# Patient Record
Sex: Male | Born: 1980 | Race: White | Hispanic: No | Marital: Married | Smoking: Current every day smoker
Health system: Southern US, Community
[De-identification: ages and names within clinical notes are randomized; demographics above are authoritative.]

## PROBLEM LIST (undated history)

## (undated) ENCOUNTER — Ambulatory Visit: Admission: EM | Payer: BC Managed Care – PPO

## (undated) DIAGNOSIS — F419 Anxiety disorder, unspecified: Secondary | ICD-10-CM

## (undated) DIAGNOSIS — G5630 Lesion of radial nerve, unspecified upper limb: Secondary | ICD-10-CM

## (undated) HISTORY — DX: Anxiety disorder, unspecified: F41.9

## (undated) HISTORY — PX: THORACOTOMY: SUR1349

---

## 2005-03-04 ENCOUNTER — Emergency Department (HOSPITAL_COMMUNITY): Admission: EM | Admit: 2005-03-04 | Discharge: 2005-03-04 | Payer: Self-pay | Admitting: Family Medicine

## 2005-03-10 ENCOUNTER — Emergency Department (HOSPITAL_COMMUNITY): Admission: EM | Admit: 2005-03-10 | Discharge: 2005-03-10 | Payer: Self-pay | Admitting: Family Medicine

## 2005-04-01 ENCOUNTER — Ambulatory Visit: Payer: Self-pay | Admitting: Internal Medicine

## 2005-04-05 ENCOUNTER — Emergency Department (HOSPITAL_COMMUNITY): Admission: EM | Admit: 2005-04-05 | Discharge: 2005-04-05 | Payer: Self-pay | Admitting: Emergency Medicine

## 2005-04-22 ENCOUNTER — Ambulatory Visit: Payer: Self-pay | Admitting: Internal Medicine

## 2005-09-02 ENCOUNTER — Emergency Department (HOSPITAL_COMMUNITY): Admission: EM | Admit: 2005-09-02 | Discharge: 2005-09-02 | Payer: Self-pay | Admitting: Family Medicine

## 2006-04-29 ENCOUNTER — Ambulatory Visit: Payer: Self-pay | Admitting: Orthopedic Surgery

## 2006-06-17 ENCOUNTER — Ambulatory Visit: Payer: Self-pay | Admitting: Orthopedic Surgery

## 2008-11-25 ENCOUNTER — Ambulatory Visit: Payer: Self-pay | Admitting: Cardiothoracic Surgery

## 2008-11-25 ENCOUNTER — Inpatient Hospital Stay (HOSPITAL_COMMUNITY): Admission: EM | Admit: 2008-11-25 | Discharge: 2008-11-28 | Payer: Self-pay | Admitting: Emergency Medicine

## 2008-12-07 ENCOUNTER — Ambulatory Visit: Payer: Self-pay | Admitting: Cardiothoracic Surgery

## 2008-12-07 ENCOUNTER — Encounter: Admission: RE | Admit: 2008-12-07 | Discharge: 2008-12-07 | Payer: Self-pay | Admitting: Cardiothoracic Surgery

## 2010-01-16 ENCOUNTER — Emergency Department (HOSPITAL_COMMUNITY): Admission: EM | Admit: 2010-01-16 | Discharge: 2010-01-16 | Payer: Self-pay | Admitting: Emergency Medicine

## 2010-03-16 ENCOUNTER — Emergency Department (HOSPITAL_COMMUNITY): Admission: EM | Admit: 2010-03-16 | Discharge: 2010-03-16 | Payer: Self-pay | Admitting: Emergency Medicine

## 2010-12-09 LAB — POCT I-STAT, CHEM 8
Chloride: 100 mEq/L (ref 96–112)
Creatinine, Ser: 0.8 mg/dL (ref 0.4–1.5)
Glucose, Bld: 102 mg/dL — ABNORMAL HIGH (ref 70–99)
HCT: 52 % (ref 39.0–52.0)
Hemoglobin: 17.7 g/dL — ABNORMAL HIGH (ref 13.0–17.0)
Potassium: 4.1 mEq/L (ref 3.5–5.1)
Sodium: 137 mEq/L (ref 135–145)

## 2010-12-09 LAB — URINALYSIS, ROUTINE W REFLEX MICROSCOPIC: Ketones, ur: 40 mg/dL — AB

## 2011-01-01 LAB — POCT I-STAT, CHEM 8
BUN: 8 mg/dL (ref 6–23)
Chloride: 108 mEq/L (ref 96–112)
Creatinine, Ser: 0.8 mg/dL (ref 0.4–1.5)
HCT: 48 % (ref 39.0–52.0)
Hemoglobin: 16.3 g/dL (ref 13.0–17.0)
Potassium: 3.1 mEq/L — ABNORMAL LOW (ref 3.5–5.1)
Sodium: 143 mEq/L (ref 135–145)
TCO2: 25 mmol/L (ref 0–100)

## 2011-02-03 NOTE — Op Note (Signed)
NAME:  [name] Russell, [name] Russell NO.:  [id number]   MEDICAL RECORD NO.:  [id number]          PATIENT TYPE:  INP   LOCATION:  2040                         FACILITY:  [facility]   PHYSICIAN:  Kerin Perna, M.D.  DATE OF BIRTH:  1981/09/05   DATE OF PROCEDURE:  11/25/2008  DATE OF DISCHARGE:                               OPERATIVE REPORT   OPERATION:  Placement of left chest tube for left spontaneous  pneumothorax.   SURGEON:  Kerin Perna, MD   PREOPERATIVE DIAGNOSIS:  25% left pneumothorax, spontaneous.   POSTOPERATIVE DIAGNOSIS:  25% left pneumothorax, spontaneous.   ANESTHESIA:  Local 1% lidocaine.   INDICATIONS FOR PROCEDURE:  The patient is a 30 year old Caucasian  smoker, who presents with his second episode of spontaneous left  pneumothorax.  The first episode was treated conservatively.  At this  time in the emergency department, his pneumothorax is 25%.  He has no  history of preceding trauma or violent coughing.  He has had shortness  of breath and chest pain.  The last time this occurred was 2 years ago  when he was hospitalized at [facility] and treated conservatively  without a chest tube.  He smokes approximately 2 pack of cigarettes a  day and is a Consulting civil engineer at [employer], currently on Spring Break.   PAST MEDICAL HISTORY:  1. No home medications.  2. Two packs per day smoking.  3. Allergic to PENICILLIN with swelling and rash.   FAMILY HISTORY:  Negative for spontaneous pneumothorax.   REVIEW OF SYSTEMS:  No significant prior hospitalizations or surgical  procedures.  No history of significant trauma or injury.  No history of  diabetes, thyroid disease, or vascular disease.  No history of fever,  productive cough, hemoptysis, or weight loss.  No history of peptic  ulcer disease, jaundice, or gallstones.  No cardiac history of  arrhythmia or murmur.   PHYSICAL EXAMINATION ON PRESENTATION:  VITAL SIGNS:  Blood pressure  120/70, pulse 110,  respirations 20, temperature 97.3, and saturation 97%  on room air.  GENERAL:  He is a young, thin white male, anxious, but in no acute  respiratory distress.  HEENT:  Normocephalic.  NECK:  Without crepitus or mass.  LYMPHATICS:  No palpable supraclavicular or cervical adenopathy.  BREASTS:  Sounds are diminished on the left, normal on the right without  wheeze.  CARDIAC:  Regular rhythm without murmur.  ABDOMEN:  Soft and nontender without mass.  EXTREMITIES:  No clubbing or cyanosis or edema.  Peripheral pulses were  intact.  NEUROLOGIC:  Intact.   LABORATORY DATA:  PA and lateral chest x-ray reveals a left 25%  pneumothorax without pleural effusion.   PLAN:  The patient will be admitted after a left chest tube is placed.      Kerin Perna, M.D.  Electronically Signed     PV/MEDQ  D:  11/25/2008  T:  11/26/2008  Job:  [id number]

## 2011-02-03 NOTE — Assessment & Plan Note (Signed)
OFFICE VISIT   [name] Russell, [name] Russell  DOB:  1981/03/30                                        December 07, 2008  CHART #:  [id number]   CURRENT PROBLEMS:  1. Left spontaneous pneumothorax, treated with a chest tube placement      November 25, 2008.  2. History of smoking.   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  After 3 days  in the hospital, the chest tube was removed and he was discharged.  He  continues to do well at home without recurrent chest pain or shortness  of breath.  He has cut his smoking back to 3-4 cigarettes a day.   PHYSICAL EXAMINATION:  VITAL SIGNS:  Blood pressure 120/70, pulse 80,  respirations 18, and saturation 98% on room air.  LUNGS:  Breath sounds are clear and equal.  CHEST:  The chest tube site is healed and sutures are removed.  CARDIAC:  Rhythm is regular and he has no subcutaneous emphysema  palpable with good pulses.   PA and lateral chest x-ray shows no evidence of recurrent or residual  pneumothorax, no pleural effusion.   PLAN:  The patient had a spontaneous pneumothorax related to probable  apical bleb.  I discussed with him the risk of recurrence to be  approximately 15-20%.  Otherwise, I have recommended that he avoid  strenuous activity or lifting more than 20 pounds for the next 2  weeks and to stop smoking.  If any symptoms recur, he will contact our  office or report to the emergency department if after hours.   Kerin Perna, M.D.  Electronically Signed   PV/MEDQ  D:  12/07/2008  T:  12/08/2008  Job:  [id number]

## 2011-02-03 NOTE — Op Note (Signed)
NAME:  CHRISTOPHERJAME, CARNELL NO.:  [id number]   MEDICAL RECORD NO.:  [id number]          PATIENT TYPE:  INP   LOCATION:  2040                         FACILITY:  [facility]   PHYSICIAN:  Kerin Perna, M.D.  DATE OF BIRTH:  1981-04-13   DATE OF PROCEDURE:  DATE OF DISCHARGE:                               OPERATIVE REPORT   OPERATION:  Left chest tube placement for spontaneous pneumothorax.   SURGEON:  Kerin Perna, MD   ANESTHESIA:  Local 1% lidocaine.   PREOPERATIVE DIAGNOSIS:  Spontaneous left 25% pneumothorax.   POSTOPERATIVE DIAGNOSIS:  Spontaneous left 25% pneumothorax.   PROCEDURE:  The patient presents with shortness of breath, chest pain,  and a 25% pneumothorax on chest x-ray which is the second occurrence at  that site.  Indications, benefits, and risks of left chest tube were  discussed and he understands and consents.   PROCEDURE:  After intravenous sedation with morphine and Versed was  administered, the left chest was prepped and draped.  Lidocaine 1% was  infiltrated in the left inframammary crease.  A 1-inch incision was  made.  Further lidocaine was instilled onto the intercostal muscles.  The left pleural space was entered with a hemostat and air was  evacuated.  Through the entry site, a 20-French chest tube was inserted  and directed to the apex and secured to the skin with 2 silk sutures.  The chest tube was connected to underwater steel drainage system, and  the Pleur-Evac was connected to suction, and a chest x-ray is pending.  A sterile dressing was applied.      Kerin Perna, M.D.  Electronically Signed     PV/MEDQ  D:  11/25/2008  T:  11/26/2008  Job:  [id number]   cc:   [facility].

## 2011-02-03 NOTE — Discharge Summary (Signed)
NAME:  Russell Russell NO.:  [id number]   MEDICAL RECORD NO.:  [id number]          PATIENT TYPE:  INP   LOCATION:  2040                         FACILITY:  [facility]   PHYSICIAN:  Kerin Perna, M.D.  DATE OF BIRTH:  April 11, 1981   DATE OF ADMISSION:  11/25/2008  DATE OF DISCHARGE:                               DISCHARGE SUMMARY   FINAL DIAGNOSIS:  Spontaneous left pneumothorax.   SECONDARY DIAGNOSIS:  Tobacco abuse.   IN-HOSPITAL OPERATIONS AND PROCEDURES:  Placement of a 20-French left  chest tube.   HISTORY AND PHYSICAL/HOSPITAL COURSE:  The patient came into the  emergency room with complaints of shortness of breath and chest pain.  He has a history of a left pneumothorax, which was about 2 years ago  where he was hospitalized at [facility] and treated  conservatively without a chest tube.  The patient continues to smoke  about 2 packs of cigarettes a day and is a Consulting civil engineer at [employer].  In the  emergency room, a chest x-ray was done and showed a 25% left recurrent  pneumothorax.  Dr. Donata Clay was consulted.  Dr. Donata Clay saw the  patient in the emergency room where he discussed undergoing placement of  a left chest tube and admission to [facility].  For further  details of the patient's past medical history and physical exam, please  see dictated H and P.   In the emergency room on November 25, 2008, Dr. Donata Clay placed a 20-French  left chest tube.  The patient was then admitted to [facility].  Followup chest x-ray after placement of the chest tube showed no  pneumothorax.  The patient had no air leak.  He was placed to water seal  on November 27, 2008, where repeat chest x-ray remained stable.  On November 28, 2008, a.m. chest x-ray showed no pneumothorax with no air leak.  Chest tube was discontinued.  A followup PA and lateral chest x-ray  remained stable with no pneumothorax.  During this time, the patient's  vital signs were monitored and  remained stable.  He was sating greater  than 90% on room air.  He remained afebrile.  He remained in normal  sinus rhythm.  The patient was out of bed, ambulating well without  difficulty.  He was tolerating diet well.  No nausea or vomiting noted.  Chest tube site is clean, dry, intact, and suture remains in place.   The patient is felt to be stable and ready for discharge to home today  November 28, 2008.   FOLLOWUP APPOINTMENTS:  Followup appointment has been arranged with Dr.  Donata Clay for December 07, 2008, at 2:30 p.m.  The patient will need to  obtain PA and lateral chest x-ray 30 minutes prior to this appointment.   ACTIVITY:  The patient instructed no driving until released to do so, no  lifting over 10 pounds.  He is told to ambulate 3-4 times per day,  progress as tolerated and continue his breathing exercises.   INCISIONAL CARE:  The patient is told he  could shower, washing his  incisions using soap and water.  He is to contact the office if he  develops any drainage or opening from any of his incision sites.   DIET:  The patient is educated on diet to be low fat, low salt.  The  patient is educated to stop smoking.   DISCHARGE MEDICATIONS:  1. Alavert D-12 daily.  2. Nicotine patch 21 mg per 24 hours to change daily transdermally.  3. Percocet 5/325 one to two tablets q.4-6 hours p.r.n. pain.      Theda [place], [place]      Kerin Perna, M.D.  Electronically Signed    KMD/MEDQ  D:  11/28/2008  T:  11/29/2008  Job:  [id number]

## 2011-08-27 ENCOUNTER — Other Ambulatory Visit: Payer: Self-pay

## 2011-08-27 ENCOUNTER — Emergency Department (HOSPITAL_COMMUNITY): Payer: BC Managed Care – PPO

## 2011-08-27 ENCOUNTER — Emergency Department (HOSPITAL_COMMUNITY)
Admission: EM | Admit: 2011-08-27 | Discharge: 2011-08-28 | Disposition: A | Discharge status: Home / Self Care | Payer: BC Managed Care – PPO | Attending: Emergency Medicine | Admitting: Emergency Medicine

## 2011-08-27 ENCOUNTER — Encounter: Payer: Self-pay | Admitting: Emergency Medicine

## 2011-08-27 DIAGNOSIS — R079 Chest pain, unspecified: Secondary | ICD-10-CM | POA: Insufficient documentation

## 2011-08-27 DIAGNOSIS — R0789 Other chest pain: Secondary | ICD-10-CM

## 2011-08-27 DIAGNOSIS — F17200 Nicotine dependence, unspecified, uncomplicated: Secondary | ICD-10-CM | POA: Insufficient documentation

## 2011-08-27 HISTORY — DX: Lesion of radial nerve, unspecified upper limb: G56.30

## 2011-08-27 NOTE — ED Notes (Signed)
PT. REPORTS LEFT ANTERIOR RIBCAGE PAIN ONSET 3 DAYS AGO , WORSE THIS EVENING ,  SOB , DENIES COUGH , NO NAUSEA OR VOMITTING , SLIGHT DIZZINESS.

## 2011-08-28 MED ORDER — HYDROCODONE-ACETAMINOPHEN 5-325 MG PO TABS: 1.0000 | ORAL_TABLET | Freq: Four times a day (QID) | ORAL | Status: AC | PRN

## 2011-08-28 NOTE — ED Provider Notes (Signed)
Medical screening examination/treatment/procedure(s) were performed by non-physician practitioner and as supervising physician I was immediately available for consultation/collaboration.  Kavaughn Faucett M Evyn Putzier, MD 08/28/11 1746 

## 2011-08-28 NOTE — ED Provider Notes (Signed)
History     CSN: [id number] Arrival date & time: 08/27/2011 11:13 PM   First MD Initiated Contact with Patient 08/28/11 0047      Chief Complaint  Patient presents with  . Chest Pain    (Consider location/radiation/quality/duration/timing/severity/associated sxs/prior treatment) HPI Comments: Patient presents to the emergency department with anterior left rib cage pain.  Pain began 3 days ago.  Patient states concerned because he has a history of spontaneous pneumothorax.  He denies fevers, night sweats, chills, cough, hemoptysis, claudication.  Patient denies recent travel, a history of radiation or cancer, and has no history of DVT.  Patient's family history is negative for cardiac problems.  Patient does not have a PCP.  Patient is a 30 y.o. male presenting with chest pain. The history is provided by the patient.  Chest Pain The chest pain began 3 - 5 days ago. Chest pain occurs constantly. The chest pain is unchanged. The quality of the pain is described as aching. The pain does not radiate. Primary symptoms include shortness of breath. Pertinent negatives for primary symptoms include no fever, no fatigue, no syncope, no cough, no wheezing, no palpitations, no abdominal pain, no nausea, no vomiting, no dizziness and no altered mental status.  Pertinent negatives for associated symptoms include no claudication, no diaphoresis, no lower extremity edema, no near-syncope, no numbness, no orthopnea, no paroxysmal nocturnal dyspnea and no weakness. He tried nothing for the symptoms. Risk factors include no known risk factors.  His past medical history is significant for spontaneous pneumothorax.  Pertinent negatives for family medical history include: family history of aortic dissection, no CAD in family, no diabetes in family, no heart disease in family, no early MI in family, no PE in family and no PVD in family.     Past Medical History  Diagnosis Date  . Pneumothorax   . Migraine   .  Radial nerve palsy     Past Surgical History  Procedure Date  . Thoracotomy     No family history on file.  History  Substance Use Topics  . Smoking status: Current Everyday Smoker  . Smokeless tobacco: Not on file  . Alcohol Use: Yes     OCCASIONAL      Review of Systems  Constitutional: Negative for fever, diaphoresis and fatigue.  Respiratory: Positive for shortness of breath. Negative for cough and wheezing.   Cardiovascular: Positive for chest pain. Negative for palpitations, orthopnea, claudication, syncope and near-syncope.  Gastrointestinal: Negative for nausea, vomiting and abdominal pain.  Neurological: Negative for dizziness, weakness and numbness.  Psychiatric/Behavioral: Negative for altered mental status.  All other systems reviewed and are negative.    Allergies  Amoxicillin; Doxycycline; and Penicillins  Home Medications   Current Outpatient Rx  Name Route Sig Dispense Refill  . ACETAMINOPHEN 500 MG PO TABS Oral Take 1,000 mg by mouth every 6 (six) hours as needed. For migraine      . ALPRAZOLAM 0.25 MG PO TABS Oral Take 0.25 mg by mouth daily as needed. For anxiety     . NAPROXEN SODIUM 220 MG PO TABS Oral Take 220 mg by mouth 2 (two) times daily with a meal.        BP 126/83  SpO2 100%  Physical Exam  Nursing note and vitals reviewed. Constitutional: He is oriented to person, place, and time. He appears well-developed and well-nourished. No distress.  HENT:  Head: Normocephalic and atraumatic.  Eyes:       Normal appearance  Neck:  Normal range of motion.  Cardiovascular: Normal rate, regular rhythm and normal heart sounds.   Pulmonary/Chest: Effort normal and breath sounds normal. No respiratory distress. He exhibits tenderness.    Abdominal: Soft. Bowel sounds are normal. He exhibits no distension. There is no tenderness. There is no guarding.  Musculoskeletal: He exhibits no edema and no tenderness.       2+ Dorsalis Pedis pulses    Neurological: He is alert and oriented to person, place, and time. GCS eye subscore is 4. GCS verbal subscore is 5. GCS motor subscore is 6.  Skin: Skin is warm and dry. No rash noted. He is not diaphoretic.  Psychiatric: He has a normal mood and affect. His behavior is normal.    ED Course  Procedures (including critical care time)  Labs Reviewed - No data to display Dg Chest 2 View  08/27/2011  *RADIOLOGY REPORT*  Clinical Data: Chest pain and tightness; shortness of breath. History of spontaneous pneumothorax.  CHEST - 2 VIEW  Comparison: Chest radiograph performed 12/07/2008  Findings: The lungs are well-aerated.  There is no evidence of focal opacification, pleural effusion or pneumothorax.  There is increased biapical scarring, with mildly more prominent associated right apical blebs.  The heart is normal in size; the mediastinal contour is within normal limits.  No acute osseous abnormalities are seen.  Bilateral metallic nipple piercings are noted.  IMPRESSION:  1.  No acute cardiopulmonary process seen. 2.  Increased biapical scarring noted, with mildly more prominent associated right apical blebs.  Original Report Authenticated By: Tonia Ghent, M.D.     No diagnosis found.  Pt discussed w Dr. Fonnie Jarvis who agrees with plan to dc. Pt states he will make a follow up apt next week.   MDM  Chest wall pain         [place], [place] 08/28/11 0104

## 2013-04-03 ENCOUNTER — Other Ambulatory Visit: Payer: Self-pay | Admitting: Nurse Practitioner

## 2013-04-03 ENCOUNTER — Telehealth: Payer: Self-pay | Admitting: Nurse Practitioner

## 2013-04-03 MED ORDER — BUPROPION HCL ER (XL) 150 MG PO TB24: 300.0000 mg | ORAL_TABLET | Freq: Every day | ORAL | Status: DC

## 2013-04-03 NOTE — Telephone Encounter (Signed)
Tried to contact patient to find out about med and dose because not in computer but he did not answer

## 2013-04-04 MED ORDER — BUPROPION HCL ER (SR) 150 MG PO TB12: 150.0000 mg | ORAL_TABLET | Freq: Two times a day (BID) | ORAL | Status: DC

## 2013-04-04 NOTE — Telephone Encounter (Signed)
wellbutrin ° °

## 2013-07-31 ENCOUNTER — Other Ambulatory Visit: Payer: Self-pay | Admitting: Nurse Practitioner

## 2013-08-02 ENCOUNTER — Telehealth: Payer: Self-pay | Admitting: Nurse Practitioner

## 2013-08-02 ENCOUNTER — Encounter: Payer: Self-pay | Admitting: General Practice

## 2013-08-02 ENCOUNTER — Ambulatory Visit (INDEPENDENT_AMBULATORY_CARE_PROVIDER_SITE_OTHER): Payer: BC Managed Care – PPO | Admitting: General Practice

## 2013-08-02 ENCOUNTER — Encounter (INDEPENDENT_AMBULATORY_CARE_PROVIDER_SITE_OTHER): Payer: Self-pay

## 2013-08-02 VITALS — BP 116/81 | HR 82 | Temp 97.2°F | Ht 69.0 in | Wt 172.0 lb

## 2013-08-02 DIAGNOSIS — F411 Generalized anxiety disorder: Secondary | ICD-10-CM

## 2013-08-02 DIAGNOSIS — G43909 Migraine, unspecified, not intractable, without status migrainosus: Secondary | ICD-10-CM

## 2013-08-02 MED ORDER — HYDROXYZINE HCL 10 MG PO TABS: 10.0000 mg | ORAL_TABLET | Freq: Every day | ORAL | Status: DC | PRN

## 2013-08-02 MED ORDER — KETOROLAC TROMETHAMINE 60 MG/2ML IM SOLN
60.0000 mg | Freq: Once | INTRAMUSCULAR | Status: AC
  Administered 2013-08-02: 60 mg via INTRAMUSCULAR

## 2013-08-02 NOTE — Telephone Encounter (Signed)
APPT MADE

## 2013-08-02 NOTE — Patient Instructions (Addendum)
Smoking Cessation Quitting smoking is important to your health and has many advantages. However, it is not always easy to quit since nicotine is a very addictive drug. Often times, people try 3 times or more before being able to quit. This document explains the best ways for you to prepare to quit smoking. Quitting takes hard work and a lot of effort, but you can do it. ADVANTAGES OF QUITTING SMOKING  You will live longer, feel better, and live better.  Your body will feel the impact of quitting smoking almost immediately.  Within 20 minutes, blood pressure decreases. Your pulse returns to its normal level.  After 8 hours, carbon monoxide levels in the blood return to normal. Your oxygen level increases.  After 24 hours, the chance of having a heart attack starts to decrease. Your breath, hair, and body stop smelling like smoke.  After 48 hours, damaged nerve endings begin to recover. Your sense of taste and smell improve.  After 72 hours, the body is virtually free of nicotine. Your bronchial tubes relax and breathing becomes easier.  After 2 to 12 weeks, lungs can hold more air. Exercise becomes easier and circulation improves.  The risk of having a heart attack, stroke, cancer, or lung disease is greatly reduced.  After 1 year, the risk of coronary heart disease is cut in half.  After 5 years, the risk of stroke falls to the same as a nonsmoker.  After 10 years, the risk of lung cancer is cut in half and the risk of other cancers decreases significantly.  After 15 years, the risk of coronary heart disease drops, usually to the level of a nonsmoker.  If you are pregnant, quitting smoking will improve your chances of having a healthy baby.  The people you live with, especially any children, will be healthier.  You will have extra money to spend on things other than cigarettes. QUESTIONS TO THINK ABOUT BEFORE ATTEMPTING TO QUIT You may want to talk about your answers with your  caregiver.  Why do you want to quit?  If you tried to quit in the past, what helped and what did not?  What will be the most difficult situations for you after you quit? How will you plan to handle them?  Who can help you through the tough times? Your family? Friends? A caregiver?  What pleasures do you get from smoking? What ways can you still get pleasure if you quit? Here are some questions to ask your caregiver:  How can you help me to be successful at quitting?  What medicine do you think would be best for me and how should I take it?  What should I do if I need more help?  What is smoking withdrawal like? How can I get information on withdrawal? GET READY  Set a quit date.  Change your environment by getting rid of all cigarettes, ashtrays, matches, and lighters in your home, car, or work. Do not let people smoke in your home.  Review your past attempts to quit. Think about what worked and what did not. GET SUPPORT AND ENCOURAGEMENT You have a better chance of being successful if you have help. You can get support in many ways.  Tell your family, friends, and co-workers that you are going to quit and need their support. Ask them not to smoke around you.  Get individual, group, or telephone counseling and support. Programs are available at local hospitals and health centers. Call your local health department for   information about programs in your area.  Spiritual beliefs and practices may help some smokers quit.  Download a "quit meter" on your computer to keep track of quit statistics, such as how long you have gone without smoking, cigarettes not smoked, and money saved.  Get a self-help book about quitting smoking and staying off of tobacco. LEARN NEW SKILLS AND BEHAVIORS  Distract yourself from urges to smoke. Talk to someone, go for a walk, or occupy your time with a task.  Change your normal routine. Take a different route to work. Drink tea instead of coffee.  Eat breakfast in a different place.  Reduce your stress. Take a hot bath, exercise, or read a book.  Plan something enjoyable to do every day. Reward yourself for not smoking.  Explore interactive web-based programs that specialize in helping you quit. GET MEDICINE AND USE IT CORRECTLY Medicines can help you stop smoking and decrease the urge to smoke. Combining medicine with the above behavioral methods and support can greatly increase your chances of successfully quitting smoking.  Nicotine replacement therapy helps deliver nicotine to your body without the negative effects and risks of smoking. Nicotine replacement therapy includes nicotine gum, lozenges, inhalers, nasal sprays, and skin patches. Some may be available over-the-counter and others require a prescription.  Antidepressant medicine helps people abstain from smoking, but how this works is unknown. This medicine is available by prescription.  Nicotinic receptor partial agonist medicine simulates the effect of nicotine in your brain. This medicine is available by prescription. Ask your caregiver for advice about which medicines to use and how to use them based on your health history. Your caregiver will tell you what side effects to look out for if you choose to be on a medicine or therapy. Carefully read the information on the package. Do not use any other product containing nicotine while using a nicotine replacement product.  RELAPSE OR DIFFICULT SITUATIONS Most relapses occur within the first 3 months after quitting. Do not be discouraged if you start smoking again. Remember, most people try several times before finally quitting. You may have symptoms of withdrawal because your body is used to nicotine. You may crave cigarettes, be irritable, feel very hungry, cough often, get headaches, or have difficulty concentrating. The withdrawal symptoms are only temporary. They are strongest when you first quit, but they will go away within  10 14 days. To reduce the chances of relapse, try to:  Avoid drinking alcohol. Drinking lowers your chances of successfully quitting.  Reduce the amount of caffeine you consume. Once you quit smoking, the amount of caffeine in your body increases and can give you symptoms, such as a rapid heartbeat, sweating, and anxiety.  Avoid smokers because they can make you want to smoke.  Do not let weight gain distract you. Many smokers will gain weight when they quit, usually less than 10 pounds. Eat a healthy diet and stay active. You can always lose the weight gained after you quit.  Find ways to improve your mood other than smoking. FOR MORE INFORMATION  www.smokefree.gov  Document Released: 09/01/2001 Document Revised: 03/08/2012 Document Reviewed: 12/17/2011 [facility] Patient Information 2014 [place], [place].  Migraine Headache A migraine headache is an intense, throbbing pain on one or both sides of your head. A migraine can last for 30 minutes to several hours. CAUSES  The exact cause of a migraine headache is not always known. However, a migraine may be caused when nerves in the brain become irritated and release chemicals  that cause inflammation. This causes pain. SYMPTOMS  Pain on one or both sides of your head.  Pulsating or throbbing pain.  Severe pain that prevents daily activities.  Pain that is aggravated by any physical activity.  Nausea, vomiting, or both.  Dizziness.  Pain with exposure to bright lights, loud noises, or activity.  General sensitivity to bright lights, loud noises, or smells. Before you get a migraine, you may get warning signs that a migraine is coming (aura). An aura may include:  Seeing flashing lights.  Seeing bright spots, halos, or zig-zag lines.  Having tunnel vision or blurred vision.  Having feelings of numbness or tingling.  Having trouble talking.  Having muscle weakness. MIGRAINE  TRIGGERS  Alcohol.  Smoking.  Stress.  Menstruation.  Aged cheeses.  Foods or drinks that contain nitrates, glutamate, aspartame, or tyramine.  Lack of sleep.  Chocolate.  Caffeine.  Hunger.  Physical exertion.  Fatigue.  Medicines used to treat chest pain (nitroglycerine), birth control pills, estrogen, and some blood pressure medicines. DIAGNOSIS  A migraine headache is often diagnosed based on:  Symptoms.  Physical examination.  A CT scan or MRI of your head. TREATMENT Medicines may be given for pain and nausea. Medicines can also be given to help prevent recurrent migraines.  HOME CARE INSTRUCTIONS  Only take over-the-counter or prescription medicines for pain or discomfort as directed by your caregiver. The use of long-term narcotics is not recommended.  Lie down in a dark, quiet room when you have a migraine.  Keep a journal to find out what may trigger your migraine headaches. For example, write down:  What you eat and drink.  How much sleep you get.  Any change to your diet or medicines.  Limit alcohol consumption.  Quit smoking if you smoke.  Get 7 to 9 hours of sleep, or as recommended by your caregiver.  Limit stress.  Keep lights dim if bright lights bother you and make your migraines worse. SEEK IMMEDIATE MEDICAL CARE IF:   Your migraine becomes severe.  You have a fever.  You have a stiff neck.  You have vision loss.  You have muscular weakness or loss of muscle control.  You start losing your balance or have trouble walking.  You feel faint or pass out.  You have severe symptoms that are different from your first symptoms. MAKE SURE YOU:   Understand these instructions.  Will watch your condition.  Will get help right away if you are not doing well or get worse. Document Released: 09/07/2005 Document Revised: 11/30/2011 Document Reviewed: 08/28/2011 [facility] Patient Information 2014 [place], [place].

## 2013-08-02 NOTE — Progress Notes (Signed)
  Subjective:    Patient ID: [name] Russell, male    DOB: 04/22/1981, 32 y.o.   MRN: [id number]  HPI Patient presents today for medication refill. He reports being on wellbutrin sr, that he began for anxiety and to stop smoking. Reports not taking the medication (wellbutrin) for 3 days and can't tell a difference. He would like to try a different medication for anxiety and verbalizes that he isn't mentally ready to stop smoking. He reports a history of migraines that began at age 44. He reports being seen by several providers, but denies a headache clinic. Reports having a headache today that is similar to migraine (sensitive to light and sound). Yeiren reports having a stressful job and working 60 hours a week at times. He reports liking his job. Denies thoughts of harming self or others. Reports taking xanax in the past and is inquiring about restarting this medication.     Review of Systems  Constitutional: Negative for fever and chills.  Respiratory: Negative for chest tightness and shortness of breath.   Cardiovascular: Negative for chest pain and palpitations.  Neurological: Positive for headaches. Negative for dizziness, speech difficulty and weakness.  Psychiatric/Behavioral: Negative for suicidal ideas and self-injury.       Objective:   Physical Exam  Constitutional: He is oriented to person, place, and time. He appears well-developed and well-nourished.  HENT:  Head: Normocephalic and atraumatic.  Right Ear: External ear normal.  Left Ear: External ear normal.  Eyes: EOM are normal. Pupils are equal, round, and reactive to light.  Neck: Normal range of motion. Neck supple. No thyromegaly present.  Cardiovascular: Normal rate, regular rhythm and normal heart sounds.   Pulmonary/Chest: Effort normal and breath sounds normal.  Lymphadenopathy:    He has no cervical adenopathy.  Neurological: He is alert and oriented to person, place, and time.  Skin: Skin is warm and dry.   Psychiatric: He has a normal mood and affect.          Assessment & Plan:  1. Migraine  - AMB referral to headache clinic - ketorolac (TORADOL) injection 60 mg; Inject 2 mLs (60 mg total) into the muscle once. -discussed keeping a diary of when headaches occur and activities surrounding -RTO is symptoms worsen or seek emergency medical treatment  2. Generalized anxiety disorder -discussed lexapro, but patient decline, he inquired about xanax or ativan -discussed reason that xanax or ativan wasn't being prescribed -discussed stress reduction and relaxation techniques - hydrOXYzine (ATARAX/VISTARIL) 10 MG tablet; Take 1 tablet (10 mg total) by mouth daily as needed.  Dispense: 30 tablet; Refill: 0 -follow up in one week for reevaluation -Patient verbalized understanding Coralie Keens, FNP-C

## 2013-09-18 ENCOUNTER — Other Ambulatory Visit: Payer: Self-pay

## 2013-09-18 DIAGNOSIS — F411 Generalized anxiety disorder: Secondary | ICD-10-CM

## 2013-09-18 MED ORDER — HYDROXYZINE HCL 10 MG PO TABS: 10.0000 mg | ORAL_TABLET | Freq: Every day | ORAL | Status: DC | PRN

## 2014-02-14 ENCOUNTER — Telehealth: Payer: Self-pay | Admitting: General Practice

## 2014-02-14 NOTE — Telephone Encounter (Signed)
appt given for 6/8 with christy hawks

## 2014-02-26 ENCOUNTER — Ambulatory Visit: Payer: BC Managed Care – PPO | Admitting: Family

## 2014-03-14 ENCOUNTER — Ambulatory Visit (INDEPENDENT_AMBULATORY_CARE_PROVIDER_SITE_OTHER): Payer: BC Managed Care – PPO | Admitting: Physician Assistant

## 2014-03-14 ENCOUNTER — Encounter: Payer: Self-pay | Admitting: Physician Assistant

## 2014-03-14 VITALS — BP 110/76 | Temp 98.8°F | Ht 69.0 in | Wt 179.2 lb

## 2014-03-14 DIAGNOSIS — R5383 Other fatigue: Principal | ICD-10-CM

## 2014-03-14 DIAGNOSIS — R5381 Other malaise: Secondary | ICD-10-CM

## 2014-03-14 DIAGNOSIS — F411 Generalized anxiety disorder: Secondary | ICD-10-CM

## 2014-03-14 MED ORDER — BUPROPION HCL ER (SR) 150 MG PO TB12: 150.0000 mg | ORAL_TABLET | Freq: Two times a day (BID) | ORAL | Status: DC

## 2014-03-14 NOTE — Progress Notes (Signed)
Subjective:     Patient ID: [name] Russell, male   DOB: March 13, 1981, 33 y.o.   MRN: [id number]  HPI Pt here for recheck of his chronic anxiety He has seen multiple providers for same Prev on Wellbutrin which worked for a while then stopped He is also wanting full labs due to fatigue/wgt gain  Review of Systems  Constitutional: Positive for activity change, fatigue and unexpected weight change. Negative for appetite change.  HENT: Negative.   Respiratory: Negative.   Cardiovascular: Negative.   Gastrointestinal: Negative.   Psychiatric/Behavioral: Positive for sleep disturbance and decreased concentration. Negative for suicidal ideas, self-injury and agitation. The patient is nervous/anxious. The patient is not hyperactive.        Objective:   Physical Exam  Nursing note and vitals reviewed. Constitutional: He appears well-developed and well-nourished.  Neck: Neck supple. No thyromegaly present.  Cardiovascular: Normal rate, regular rhythm, normal heart sounds and intact distal pulses.   Pulmonary/Chest: Effort normal and breath sounds normal.  Lymphadenopathy:    He has no cervical adenopathy.  Psychiatric: He has a normal mood and affect. His behavior is normal. Judgment and thought content normal.       Assessment:     Anxiety Fatigue    Plan:     Discussed with pt I was not going to prescribe Xanax/Ativan Full labs done today Discussed some of his sx may be related to depression Also discussed given his long term issues feel his should follow up with Psych Wellbutrin rx today F/U in 3 days

## 2014-03-14 NOTE — Patient Instructions (Signed)
Generalized Anxiety Disorder  Generalized anxiety disorder (GAD) is a mental disorder. It interferes with life functions, including relationships, work, and school.  GAD is different from normal anxiety, which everyone experiences at some point in their lives in response to specific life events and activities. Normal anxiety actually helps us prepare for and get through these life events and activities. Normal anxiety goes away after the event or activity is over.   GAD causes anxiety that is not necessarily related to specific events or activities. It also causes excess anxiety in proportion to specific events or activities. The anxiety associated with GAD is also difficult to control. GAD can vary from mild to severe. People with severe GAD can have intense waves of anxiety with physical symptoms (panic attacks).   SYMPTOMS  The anxiety and worry associated with GAD are difficult to control. This anxiety and worry are related to many life events and activities and also occur more days than not for 6 months or longer. People with GAD also have three or more of the following symptoms (one or more in children):  · Restlessness.    · Fatigue.  · Difficulty concentrating.    · Irritability.  · Muscle tension.  · Difficulty sleeping or unsatisfying sleep.  DIAGNOSIS  GAD is diagnosed through an assessment by your caregiver. Your caregiver will ask you questions about your mood, physical symptoms, and events in your life. Your caregiver may ask you about your medical history and use of alcohol or drugs, including prescription medications. Your caregiver may also do a physical exam and blood tests. Certain medical conditions and the use of certain substances can cause symptoms similar to those associated with GAD. Your caregiver may refer you to a mental health specialist for further evaluation.  TREATMENT  The following therapies are usually used to treat GAD:   · Medication--Antidepressant medication usually is  prescribed for long-term daily control. Antianxiety medications may be added in severe cases, especially when panic attacks occur.    · Talk therapy (psychotherapy)--Certain types of talk therapy can be helpful in treating GAD by providing support, education, and guidance. A form of talk therapy called cognitive behavioral therapy can teach you healthy ways to think about and react to daily life events and activities.  · Stress management techniques--These include yoga, meditation, and exercise and can be very helpful when they are practiced regularly.  A mental health specialist can help determine which treatment is best for you. Some people see improvement with one therapy. However, other people require a combination of therapies.  Document Released: 01/02/2013 Document Reviewed: 01/02/2013  [employer]® Patient Information ©2015 [employer]. This information is not intended to replace advice given to you by your health care Dorraine Ellender. Make sure you discuss any questions you have with your health care Dejuan Elman.

## 2014-03-15 ENCOUNTER — Telehealth: Payer: Self-pay | Admitting: Physician Assistant

## 2014-03-15 LAB — CMP14+EGFR
ALBUMIN: 4.9 g/dL (ref 3.5–5.5)
ALT: 29 IU/L (ref 0–44)
AST: 20 IU/L (ref 0–40)
Albumin/Globulin Ratio: 1.9 (ref 1.1–2.5)
Alkaline Phosphatase: 73 IU/L (ref 39–117)
BUN/Creatinine Ratio: 15 (ref 8–19)
BUN: 11 mg/dL (ref 6–20)
CALCIUM: 10 mg/dL (ref 8.7–10.2)
CHLORIDE: 100 mmol/L (ref 97–108)
CO2: 23 mmol/L (ref 18–29)
CREATININE: 0.72 mg/dL — AB (ref 0.76–1.27)
GFR calc Af Amer: 144 mL/min/{1.73_m2} (ref >59)
GFR, EST NON AFRICAN AMERICAN: 124 mL/min/{1.73_m2} (ref >59)
GLOBULIN, TOTAL: 2.6 g/dL (ref 1.5–4.5)
GLUCOSE: 83 mg/dL (ref 65–99)
Potassium: 4.5 mmol/L (ref 3.5–5.2)
SODIUM: 142 mmol/L (ref 134–144)
TOTAL PROTEIN: 7.5 g/dL (ref 6.0–8.5)
Total Bilirubin: 0.6 mg/dL (ref 0.0–1.2)

## 2014-03-15 LAB — THYROID PANEL WITH TSH
Free Thyroxine Index: 2.8 (ref 1.2–4.9)
T3 UPTAKE RATIO: 26 % (ref 24–39)
T4, Total: 10.6 ug/dL (ref 4.5–12.0)
TSH: 2.56 u[IU]/mL (ref 0.450–4.500)

## 2014-03-15 NOTE — Telephone Encounter (Signed)
Patient aware of results.

## 2014-03-30 ENCOUNTER — Emergency Department (HOSPITAL_COMMUNITY)
Admission: EM | Admit: 2014-03-30 | Discharge: 2014-03-30 | Disposition: A | Discharge status: Home / Self Care | Payer: BC Managed Care – PPO | Attending: Emergency Medicine | Admitting: Emergency Medicine

## 2014-03-30 ENCOUNTER — Encounter (HOSPITAL_COMMUNITY): Payer: Self-pay | Admitting: Emergency Medicine

## 2014-03-30 DIAGNOSIS — F411 Generalized anxiety disorder: Secondary | ICD-10-CM | POA: Insufficient documentation

## 2014-03-30 DIAGNOSIS — R51 Headache: Secondary | ICD-10-CM | POA: Insufficient documentation

## 2014-03-30 DIAGNOSIS — Y9289 Other specified places as the place of occurrence of the external cause: Secondary | ICD-10-CM | POA: Insufficient documentation

## 2014-03-30 DIAGNOSIS — Z8709 Personal history of other diseases of the respiratory system: Secondary | ICD-10-CM | POA: Insufficient documentation

## 2014-03-30 DIAGNOSIS — Z8679 Personal history of other diseases of the circulatory system: Secondary | ICD-10-CM | POA: Insufficient documentation

## 2014-03-30 DIAGNOSIS — W57XXXA Bitten or stung by nonvenomous insect and other nonvenomous arthropods, initial encounter: Secondary | ICD-10-CM | POA: Insufficient documentation

## 2014-03-30 DIAGNOSIS — H65192 Other acute nonsuppurative otitis media, left ear: Secondary | ICD-10-CM

## 2014-03-30 DIAGNOSIS — T148 Other injury of unspecified body region: Secondary | ICD-10-CM | POA: Insufficient documentation

## 2014-03-30 DIAGNOSIS — Z79899 Other long term (current) drug therapy: Secondary | ICD-10-CM | POA: Insufficient documentation

## 2014-03-30 DIAGNOSIS — J029 Acute pharyngitis, unspecified: Secondary | ICD-10-CM | POA: Insufficient documentation

## 2014-03-30 DIAGNOSIS — Z880 Allergy status to penicillin: Secondary | ICD-10-CM | POA: Insufficient documentation

## 2014-03-30 DIAGNOSIS — Y9389 Activity, other specified: Secondary | ICD-10-CM | POA: Insufficient documentation

## 2014-03-30 DIAGNOSIS — H65199 Other acute nonsuppurative otitis media, unspecified ear: Secondary | ICD-10-CM | POA: Insufficient documentation

## 2014-03-30 DIAGNOSIS — R52 Pain, unspecified: Secondary | ICD-10-CM | POA: Insufficient documentation

## 2014-03-30 DIAGNOSIS — Z791 Long term (current) use of non-steroidal anti-inflammatories (NSAID): Secondary | ICD-10-CM | POA: Insufficient documentation

## 2014-03-30 MED ORDER — HYDROXYZINE HCL 25 MG PO TABS
25.0000 mg | ORAL_TABLET | Freq: Once | ORAL | Status: AC
  Administered 2014-03-30: 25 mg via ORAL
  Filled 2014-03-30: qty 1

## 2014-03-30 MED ORDER — DEXAMETHASONE SODIUM PHOSPHATE 4 MG/ML IJ SOLN: 10.0000 mg | Freq: Once | INTRAMUSCULAR | Status: DC

## 2014-03-30 MED ORDER — AZITHROMYCIN 250 MG PO TABS: 250.0000 mg | ORAL_TABLET | Freq: Every day | ORAL | Status: DC

## 2014-03-30 MED ORDER — DEXAMETHASONE SODIUM PHOSPHATE 4 MG/ML IJ SOLN
10.0000 mg | Freq: Once | INTRAMUSCULAR | Status: AC
  Administered 2014-03-30: 10 mg via INTRAMUSCULAR
  Filled 2014-03-30: qty 3

## 2014-03-30 MED ORDER — TRIAMCINOLONE ACETONIDE 0.1 % EX CREA: 1.0000 "application " | TOPICAL_CREAM | Freq: Three times a day (TID) | CUTANEOUS | Status: DC

## 2014-03-30 MED ORDER — HYDROXYZINE HCL 25 MG PO TABS: 25.0000 mg | ORAL_TABLET | Freq: Four times a day (QID) | ORAL | Status: DC

## 2014-03-30 NOTE — Discharge Instructions (Signed)
Insect Bite Mosquitoes, flies, fleas, bedbugs, and other insects can bite. Insect bites are different from insect stings. The bite may be red, puffy (swollen), and itchy for 2 to 4 days. Most bites get better on their own. HOME CARE   Do not scratch the bite.  Keep the bite clean and dry. Wash the bite with soap and water.  Put ice on the bite.  Put ice in a plastic bag.  Place a towel between your skin and the bag.  Leave the ice on for 20 minutes, 4 times a day. Do this for the first 2 to 3 days, or as told by your doctor.  You may use medicated lotions or creams to lessen itching as told by your doctor.  Only take medicines as told by your doctor.  If you are given medicines (antibiotics), take them as told. Finish them even if you start to feel better. You may need a tetanus shot if:  You cannot remember when you had your last tetanus shot.  You have never had a tetanus shot.  The injury broke your skin. If you need a tetanus shot and you choose not to have one, you may get tetanus. Sickness from tetanus can be serious. GET HELP RIGHT AWAY IF:   You have more pain, redness, or puffiness.  You see a red line on the skin coming from the bite.  You have a fever.  You have joint pain.  You have a headache or neck pain.  You feel weak.  You have a rash.  You have chest pain, or you are short of breath.  You have belly (abdominal) pain.  You feel sick to your stomach (nauseous) or throw up (vomit).  You feel very tired or sleepy. MAKE SURE YOU:   Understand these instructions.  Will watch your condition.  Will get help right away if you are not doing well or get worse. Document Released: 09/04/2000 Document Revised: 11/30/2011 Document Reviewed: 04/08/2011 [facility] Patient Information 2015 [place], [place]. This information is not intended to replace advice given to you by your health care provider. Make sure you discuss any questions you have with your health  care provider.  Otitis Media Otitis media is redness, soreness, and swelling (inflammation) of the middle ear. Otitis media may be caused by allergies or, most commonly, by infection. Often it occurs as a complication of the common cold. SIGNS AND SYMPTOMS Symptoms of otitis media may include:  Earache.  Fever.  Ringing in your ear.  Headache.  Leakage of fluid from the ear. DIAGNOSIS To diagnose otitis media, your health care provider will examine your ear with an otoscope. This is an instrument that allows your health care provider to see into your ear in order to examine your eardrum. Your health care provider also will ask you questions about your symptoms. TREATMENT  Typically, otitis media resolves on its own within 3-5 days. Your health care provider may prescribe medicine to ease your symptoms of pain. If otitis media does not resolve within 5 days or is recurrent, your health care provider may prescribe antibiotic medicines if he or she suspects that a bacterial infection is the cause. HOME CARE INSTRUCTIONS   Take your medicine as directed until it is gone, even if you feel better after the first few days.  Only take over-the-counter or prescription medicines for pain, discomfort, or fever as directed by your health care provider.  Follow up with your health care provider as directed. SEEK MEDICAL  CARE IF:  You have otitis media only in one ear, or bleeding from your nose, or both.  You notice a lump on your neck.  You are not getting better in 3-5 days.  You feel worse instead of better. SEEK IMMEDIATE MEDICAL CARE IF:   You have pain that is not controlled with medicine.  You have swelling, redness, or pain around your ear or stiffness in your neck.  You notice that part of your face is paralyzed.  You notice that the bone behind your ear (mastoid) is tender when you touch it. MAKE SURE YOU:   Understand these instructions.  Will watch your  condition.  Will get help right away if you are not doing well or get worse. Document Released: 06/12/2004 Document Revised: 09/12/2013 Document Reviewed: 04/04/2013 [facility] Patient Information 2015 [place]. This information is not intended to replace advice given to you by your health care provider. Make sure you discuss any questions you have with your health care provider.

## 2014-03-30 NOTE — ED Notes (Signed)
Ticks found on me yesterday, breaking out in a rash today and had a headache per pt.

## 2014-03-31 NOTE — ED Provider Notes (Signed)
Medical screening examination/treatment/procedure(s) were performed by non-physician practitioner and as supervising physician I was immediately available for consultation/collaboration.   EKG Interpretation None        Joya Gaskinsonald W Welcome Fults, MD 03/31/14 [phone]

## 2014-03-31 NOTE — ED Provider Notes (Signed)
CSN: [id number]     Arrival date & time 03/30/14  2233 History   First MD Initiated Contact with Patient 03/30/14 2303     Chief Complaint  Patient presents with  . Rash     (Consider location/radiation/quality/duration/timing/severity/associated sxs/prior Treatment) Patient is a 33 y.o. male presenting with rash. The history is provided by the patient.  Rash Location:  Torso and leg Torso rash location:  Lower back, abd LLQ and abd RLQ Leg rash location:  L upper leg, R upper leg, L lower leg and R lower leg Quality: blistering, burning, itchiness and redness   Quality: not bruising, not dry, not painful, not peeling, not scaling, not swelling and not weeping   Severity:  Mild Onset quality:  Sudden Duration:  10 hours Timing:  Constant Progression:  Spreading Chronicity:  New Context: insect bite/sting   Context comment:  Patient work s outside. Relieved by:  Nothing Worsened by:  Nothing tried Ineffective treatments:  None tried Associated symptoms: headaches, myalgias and sore throat   Associated symptoms: no abdominal pain, no fever, no joint pain, no nausea, no periorbital edema, no shortness of breath, no throat swelling, no tongue swelling, not vomiting and not wheezing    Patient c/o left ear pain, intermittent headaches, generalized body aches and sore throat for several days.  He also reports finding several ticks on him yesterday, but states the ticks were not attached.  Today, he states that he noticed multiple  Small red bumps to his abdomen, back and both legs.  Reports extreme itching to his legs.  He denies fever, vomiting, joint pains or neck stiffness.  He has not tried any medications or therpies at home prior to ED arrival.     Past Medical History  Diagnosis Date  . Pneumothorax   . Migraine   . Radial nerve palsy   . Anxiety    Past Surgical History  Procedure Laterality Date  . Thoracotomy     History reviewed. No pertinent family history. History   Substance Use Topics  . Smoking status: Current Every Day Smoker -- 1.00 packs/day  . Smokeless tobacco: Not on file  . Alcohol Use: Yes     Comment: OCCASIONAL    Review of Systems  Constitutional: Negative for fever.  HENT: Positive for sore throat.   Respiratory: Negative for shortness of breath and wheezing.   Cardiovascular: Negative for chest pain.  Gastrointestinal: Negative for nausea, vomiting and abdominal pain.  Musculoskeletal: Positive for myalgias. Negative for arthralgias, back pain, joint swelling, neck pain and neck stiffness.  Skin: Positive for rash.  Neurological: Positive for headaches. Negative for dizziness, syncope, weakness and numbness.      Allergies  Amoxicillin; Doxycycline; and Penicillins  Home Medications   Prior to Admission medications   Medication Sig Start Date End Date Taking? Authorizing Provider  acetaminophen (TYLENOL) 500 MG tablet Take 1,000 mg by mouth every 6 (six) hours as needed. For migraine      Historical Provider, MD  ALPRAZolam (XANAX) 0.25 MG tablet Take 0.25 mg by mouth daily as needed. For anxiety     Historical Provider, MD  azithromycin (ZITHROMAX) 250 MG tablet Take 1 tablet (250 mg total) by mouth daily. Take first 2 tablets together, then 1 every day until finished. 03/30/14   Breiona Couvillon L. Morio Widen, PA-C  buPROPion (WELLBUTRIN SR) 150 MG 12 hr tablet Take 1 tablet (150 mg total) by mouth 2 (two) times daily. 03/14/14   Inis Sizer, PA-C  hydrOXYzine (  ATARAX/VISTARIL) 10 MG tablet Take 1 tablet (10 mg total) by mouth daily as needed. 09/18/13   Coralie KeensMae E Haliburton, FNP  hydrOXYzine (ATARAX/VISTARIL) 25 MG tablet Take 1 tablet (25 mg total) by mouth every 6 (six) hours. As needed for itching 03/30/14   Roshunda Keir L. Ludie Pavlik, PA-C  naproxen sodium (ANAPROX) 220 MG tablet Take 220 mg by mouth 2 (two) times daily with a meal.      Historical Provider, MD  triamcinolone cream (KENALOG) 0.1 % Apply 1 application topically 3 (three)  times daily. 03/30/14   Lynetta Tomczak L. Brinsley Wence, PA-C   BP 123/83  Pulse 90  Temp(Src) 98 F (36.7 C) (Oral)  Resp 20  Ht 5\' 9"  (1.753 m)  Wt 170 lb (77.111 kg)  BMI 25.09 kg/m2  SpO2 98% Physical Exam  Nursing note and vitals reviewed. Constitutional: He is oriented to person, place, and time. He appears well-developed and well-nourished. No distress.  HENT:  Head: Normocephalic and atraumatic.  Right Ear: Tympanic membrane and ear canal normal.  Left Ear: Ear canal normal. No mastoid tenderness. No hemotympanum.  Mouth/Throat: Uvula is midline, oropharynx is clear and moist and mucous membranes are normal.  Erythema of the left TM with middle ear effusion present.  No bulging.    Neck: Normal range of motion. Neck supple.  Cardiovascular: Normal rate, regular rhythm, normal heart sounds and intact distal pulses.   No murmur heard. Pulmonary/Chest: Effort normal and breath sounds normal. No respiratory distress.  Musculoskeletal: Normal range of motion. He exhibits no edema and no tenderness.  Lymphadenopathy:    He has no cervical adenopathy.  Neurological: He is alert and oriented to person, place, and time. He exhibits normal muscle tone. Coordination normal.  Skin: Skin is warm. Rash noted. There is erythema.  Multiple , scattered erythematous papules to the bilateral LE's.  No pustules, target lesions, or petechia    ED Course  Procedures (including critical care time) Labs Review Labs Reviewed - No data to display  Imaging Review No results found.   EKG Interpretation None      MDM   Final diagnoses:  Insect bites  Other acute nonsuppurative otitis media of left ear    Pt is well appearing, non-toxic.  Rash appears c/w insect bites.  Ticks were not attached.  Clinical suspicion for tick related illness is low.  Patient is requesting testing for tick illness, I have advised pt to follow-up with PMD for this.  He agrees to treatment with zithromax for the OM and  steroid cream and atarax for rash symptoms.  Pt ambulates well and appears stable for d/c.      Delanee Xin L. Trisha Mangleriplett, PA-C 03/31/14 [id number]

## 2014-04-16 ENCOUNTER — Other Ambulatory Visit: Payer: Self-pay | Admitting: Family Medicine

## 2014-04-16 ENCOUNTER — Other Ambulatory Visit: Payer: Self-pay | Admitting: *Deleted

## 2014-04-16 MED ORDER — HYDROXYZINE HCL 25 MG PO TABS: 25.0000 mg | ORAL_TABLET | Freq: Four times a day (QID) | ORAL | Status: DC

## 2014-04-16 NOTE — Telephone Encounter (Signed)
Med was called in

## 2014-04-16 NOTE — Telephone Encounter (Signed)
Given in hospital 03/30/14, needs refill only got #12, [facility]

## 2014-05-16 ENCOUNTER — Telehealth: Payer: Self-pay | Admitting: Family Medicine

## 2014-05-16 ENCOUNTER — Other Ambulatory Visit: Payer: Self-pay | Admitting: Physician Assistant

## 2014-05-16 MED ORDER — BUPROPION HCL ER (SR) 150 MG PO TB12: 150.0000 mg | ORAL_TABLET | Freq: Two times a day (BID) | ORAL | Status: DC

## 2014-05-16 NOTE — Telephone Encounter (Signed)
done

## 2014-06-19 ENCOUNTER — Other Ambulatory Visit: Payer: Self-pay | Admitting: Family Medicine

## 2014-07-27 ENCOUNTER — Other Ambulatory Visit: Payer: Self-pay | Admitting: Family Medicine

## 2014-09-22 ENCOUNTER — Other Ambulatory Visit: Payer: Self-pay | Admitting: Family Medicine

## 2014-10-15 ENCOUNTER — Telehealth: Payer: Self-pay | Admitting: Family Medicine

## 2014-10-15 MED ORDER — BUPROPION HCL ER (SR) 150 MG PO TB12: 150.0000 mg | ORAL_TABLET | Freq: Two times a day (BID) | ORAL | Status: DC

## 2014-10-15 NOTE — Telephone Encounter (Signed)
Patient aware rx sent to pharmacy.  

## 2014-10-15 NOTE — Telephone Encounter (Signed)
rx sent to pharmacy

## 2014-10-24 ENCOUNTER — Ambulatory Visit: Payer: Self-pay | Admitting: Family Medicine

## 2015-09-02 ENCOUNTER — Other Ambulatory Visit (HOSPITAL_COMMUNITY): Payer: Self-pay | Admitting: Internal Medicine

## 2015-09-02 DIAGNOSIS — J9383 Other pneumothorax: Secondary | ICD-10-CM

## 2015-09-05 ENCOUNTER — Ambulatory Visit (HOSPITAL_COMMUNITY)
Admission: RE | Admit: 2015-09-05 | Discharge: 2015-09-05 | Disposition: A | Discharge status: Home / Self Care | Payer: BLUE CROSS/BLUE SHIELD | Source: Ambulatory Visit | Attending: Internal Medicine | Admitting: Internal Medicine

## 2015-09-05 ENCOUNTER — Encounter (HOSPITAL_COMMUNITY): Payer: Self-pay

## 2015-09-05 DIAGNOSIS — N62 Hypertrophy of breast: Secondary | ICD-10-CM | POA: Insufficient documentation

## 2015-09-05 DIAGNOSIS — Z8709 Personal history of other diseases of the respiratory system: Secondary | ICD-10-CM | POA: Diagnosis present

## 2015-09-05 DIAGNOSIS — J439 Emphysema, unspecified: Secondary | ICD-10-CM | POA: Insufficient documentation

## 2015-09-05 DIAGNOSIS — R079 Chest pain, unspecified: Secondary | ICD-10-CM | POA: Insufficient documentation

## 2015-09-05 DIAGNOSIS — J9383 Other pneumothorax: Secondary | ICD-10-CM

## 2016-03-05 DIAGNOSIS — H04123 Dry eye syndrome of bilateral lacrimal glands: Secondary | ICD-10-CM | POA: Diagnosis not present

## 2016-07-08 DIAGNOSIS — R5383 Other fatigue: Secondary | ICD-10-CM | POA: Diagnosis not present

## 2016-07-08 DIAGNOSIS — Z6828 Body mass index (BMI) 28.0-28.9, adult: Secondary | ICD-10-CM | POA: Diagnosis not present

## 2016-07-08 DIAGNOSIS — J40 Bronchitis, not specified as acute or chronic: Secondary | ICD-10-CM | POA: Diagnosis not present

## 2016-10-20 ENCOUNTER — Encounter (HOSPITAL_COMMUNITY): Payer: Self-pay | Admitting: Emergency Medicine

## 2016-10-20 ENCOUNTER — Emergency Department (HOSPITAL_COMMUNITY)
Admission: EM | Admit: 2016-10-20 | Discharge: 2016-10-20 | Disposition: A | Discharge status: Home / Self Care | Payer: BLUE CROSS/BLUE SHIELD | Attending: Emergency Medicine | Admitting: Emergency Medicine

## 2016-10-20 ENCOUNTER — Emergency Department (HOSPITAL_COMMUNITY): Payer: BLUE CROSS/BLUE SHIELD

## 2016-10-20 DIAGNOSIS — M546 Pain in thoracic spine: Secondary | ICD-10-CM | POA: Diagnosis not present

## 2016-10-20 DIAGNOSIS — Y999 Unspecified external cause status: Secondary | ICD-10-CM | POA: Insufficient documentation

## 2016-10-20 DIAGNOSIS — T148XXA Other injury of unspecified body region, initial encounter: Secondary | ICD-10-CM

## 2016-10-20 DIAGNOSIS — S39012A Strain of muscle, fascia and tendon of lower back, initial encounter: Secondary | ICD-10-CM | POA: Insufficient documentation

## 2016-10-20 DIAGNOSIS — Y939 Activity, unspecified: Secondary | ICD-10-CM | POA: Diagnosis not present

## 2016-10-20 DIAGNOSIS — S3992XA Unspecified injury of lower back, initial encounter: Secondary | ICD-10-CM | POA: Diagnosis present

## 2016-10-20 DIAGNOSIS — F17200 Nicotine dependence, unspecified, uncomplicated: Secondary | ICD-10-CM | POA: Diagnosis not present

## 2016-10-20 DIAGNOSIS — Y92410 Unspecified street and highway as the place of occurrence of the external cause: Secondary | ICD-10-CM | POA: Diagnosis not present

## 2016-10-20 DIAGNOSIS — M545 Low back pain, unspecified: Secondary | ICD-10-CM

## 2016-10-20 MED ORDER — METHOCARBAMOL 500 MG PO TABS: 500.0000 mg | ORAL_TABLET | Freq: Two times a day (BID) | ORAL | 0 refills | Status: AC

## 2016-10-20 MED ORDER — ACETAMINOPHEN 325 MG PO TABS
650.0000 mg | ORAL_TABLET | Freq: Once | ORAL | Status: AC
  Administered 2016-10-20: 650 mg via ORAL
  Filled 2016-10-20: qty 2

## 2016-10-20 MED ORDER — IBUPROFEN 400 MG PO TABS
400.0000 mg | ORAL_TABLET | Freq: Once | ORAL | Status: AC
  Administered 2016-10-20: 400 mg via ORAL
  Filled 2016-10-20: qty 1

## 2016-10-20 MED ORDER — IBUPROFEN 600 MG PO TABS: 600.0000 mg | ORAL_TABLET | Freq: Four times a day (QID) | ORAL | 0 refills | Status: AC | PRN

## 2016-10-20 NOTE — ED Provider Notes (Signed)
Patient report received from T Mohr, PA-C at shift change.  Patient awaiting completion of diagnostic study.  6:43 PM Radiology results reviewed and shared with patient. No acute findings.  Patient discharged with care instructions and and return precautions.   Felicie Mornavid Dymond Gutt, NP 10/20/16 [id number]    Margarita Grizzleanielle Ray, MD 10/21/16 [phone]
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
## 2016-10-20 NOTE — ED Notes (Signed)
PT instructed to not drive or operate machinery while taking robaxin for pain.
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
## 2017-03-26 DIAGNOSIS — Z720 Tobacco use: Secondary | ICD-10-CM | POA: Diagnosis not present

## 2017-03-26 DIAGNOSIS — J029 Acute pharyngitis, unspecified: Secondary | ICD-10-CM | POA: Diagnosis not present

## 2017-03-26 DIAGNOSIS — F17200 Nicotine dependence, unspecified, uncomplicated: Secondary | ICD-10-CM | POA: Diagnosis not present

## 2017-03-26 DIAGNOSIS — J019 Acute sinusitis, unspecified: Secondary | ICD-10-CM | POA: Diagnosis not present

## 2017-03-26 DIAGNOSIS — J329 Chronic sinusitis, unspecified: Secondary | ICD-10-CM | POA: Diagnosis not present

## 2017-03-26 DIAGNOSIS — F419 Anxiety disorder, unspecified: Secondary | ICD-10-CM | POA: Diagnosis not present

## 2017-03-26 DIAGNOSIS — F329 Major depressive disorder, single episode, unspecified: Secondary | ICD-10-CM | POA: Diagnosis not present

## 2017-03-26 DIAGNOSIS — Z79899 Other long term (current) drug therapy: Secondary | ICD-10-CM | POA: Diagnosis not present

## 2017-08-01 DIAGNOSIS — Z6827 Body mass index (BMI) 27.0-27.9, adult: Secondary | ICD-10-CM | POA: Diagnosis not present

## 2017-08-01 DIAGNOSIS — S99922A Unspecified injury of left foot, initial encounter: Secondary | ICD-10-CM | POA: Diagnosis not present

## 2017-08-01 DIAGNOSIS — M79672 Pain in left foot: Secondary | ICD-10-CM | POA: Diagnosis not present

## 2017-08-10 ENCOUNTER — Emergency Department (HOSPITAL_COMMUNITY): Payer: BLUE CROSS/BLUE SHIELD

## 2017-08-10 ENCOUNTER — Emergency Department (HOSPITAL_COMMUNITY)
Admission: EM | Admit: 2017-08-10 | Discharge: 2017-08-10 | Disposition: A | Discharge status: Home / Self Care | Payer: BLUE CROSS/BLUE SHIELD | Attending: Emergency Medicine | Admitting: Emergency Medicine

## 2017-08-10 ENCOUNTER — Encounter (HOSPITAL_COMMUNITY): Payer: Self-pay | Admitting: Emergency Medicine

## 2017-08-10 DIAGNOSIS — R079 Chest pain, unspecified: Secondary | ICD-10-CM

## 2017-08-10 DIAGNOSIS — R0602 Shortness of breath: Secondary | ICD-10-CM | POA: Diagnosis not present

## 2017-08-10 DIAGNOSIS — F17210 Nicotine dependence, cigarettes, uncomplicated: Secondary | ICD-10-CM | POA: Insufficient documentation

## 2017-08-10 DIAGNOSIS — R0789 Other chest pain: Secondary | ICD-10-CM | POA: Diagnosis not present

## 2017-08-10 DIAGNOSIS — Z720 Tobacco use: Secondary | ICD-10-CM

## 2017-08-10 DIAGNOSIS — R202 Paresthesia of skin: Secondary | ICD-10-CM | POA: Diagnosis not present

## 2017-08-10 DIAGNOSIS — Z791 Long term (current) use of non-steroidal anti-inflammatories (NSAID): Secondary | ICD-10-CM | POA: Insufficient documentation

## 2017-08-10 DIAGNOSIS — J438 Other emphysema: Secondary | ICD-10-CM | POA: Diagnosis not present

## 2017-08-10 DIAGNOSIS — Z79899 Other long term (current) drug therapy: Secondary | ICD-10-CM | POA: Insufficient documentation

## 2017-08-10 LAB — BASIC METABOLIC PANEL
ANION GAP: 9 (ref 5–15)
BUN: 7 mg/dL (ref 6–20)
CHLORIDE: 105 mmol/L (ref 101–111)
CO2: 28 mmol/L (ref 22–32)
Calcium: 9.4 mg/dL (ref 8.9–10.3)
Creatinine, Ser: 0.81 mg/dL (ref 0.61–1.24)
GFR calc non Af Amer: >60 mL/min (ref >60)
Glucose, Bld: 88 mg/dL (ref 65–99)
Potassium: 3.8 mmol/L (ref 3.5–5.1)
SODIUM: 142 mmol/L (ref 135–145)

## 2017-08-10 LAB — CBC
HCT: 45.7 % (ref 39.0–52.0)
HEMOGLOBIN: 15.8 g/dL (ref 13.0–17.0)
MCH: 32 pg (ref 26.0–34.0)
MCHC: 34.6 g/dL (ref 30.0–36.0)
MCV: 92.5 fL (ref 78.0–100.0)
PLATELETS: 212 10*3/uL (ref 150–400)
RBC: 4.94 MIL/uL (ref 4.22–5.81)
RDW: 13.4 % (ref 11.5–15.5)
WBC: 9.3 10*3/uL (ref 4.0–10.5)

## 2017-08-10 LAB — I-STAT TROPONIN, ED
TROPONIN I, POC: 0 ng/mL (ref 0.00–0.08)
TROPONIN I, POC: 0 ng/mL (ref 0.00–0.08)

## 2017-08-10 LAB — D-DIMER, QUANTITATIVE (NOT AT ARMC): D DIMER QUANT: 1.12 ug{FEU}/mL — AB (ref 0.00–0.50)

## 2017-08-10 MED ORDER — SODIUM CHLORIDE 0.9 % IV BOLUS (SEPSIS)
500.0000 mL | Freq: Once | INTRAVENOUS | Status: AC
  Administered 2017-08-10: 500 mL via INTRAVENOUS

## 2017-08-10 MED ORDER — IOPAMIDOL (ISOVUE-370) INJECTION 76%
INTRAVENOUS | Status: AC
  Administered 2017-08-10: 100 mL
  Filled 2017-08-10: qty 100
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
## 2017-08-10 NOTE — ED Provider Notes (Signed)
[facility] EMERGENCY DEPARTMENT Provider Note   CSN: [id number] Arrival date & time: 08/10/17  1529     History   Chief Complaint Chief Complaint  Patient presents with  . Chest Pain  . Arm Pain  . Back Pain    HPI Denim Kalmbach is a 36 y.o. male who presents with cc of cp. He had onset of cp today . He is under sig stress. He is a smoker. The pain is described as constant, no exaggerating or alleviating factors. The patient denies SOB. He noticed some left arm tingling. He denies hx of htn, hld, early MI. He denies hx of dvt or PE .  HPI  Past Medical History:  Diagnosis Date  . Anxiety   . Migraine   . Pneumothorax   . Radial nerve palsy     Patient Active Problem List   Diagnosis Date Noted  . Anxiety state, unspecified 03/14/2014    Past Surgical History:  Procedure Laterality Date  . THORACOTOMY         Home Medications    Prior to Admission medications   Medication Sig Start Date End Date Taking? Authorizing Provider  acetaminophen (TYLENOL) 500 MG tablet Take 1,000 mg by mouth every 6 (six) hours as needed. For migraine      [provider]  ALPRAZolam (XANAX) 0.25 MG tablet Take 0.25 mg by mouth daily as needed. For anxiety     [provider]  azithromycin (ZITHROMAX) 250 MG tablet Take 1 tablet (250 mg total) by mouth daily. Take first 2 tablets together, then 1 every day until finished. 03/30/14   Triplett, Tammy, PA-C  buPROPion (WELLBUTRIN SR) 150 MG 12 hr tablet Take 1 tablet (150 mg total) by mouth 2 (two) times daily. 10/15/14   Daphine Deutscher Mary-Margaret, FNP  hydrOXYzine (ATARAX/VISTARIL) 25 MG tablet Take 1 tablet (25 mg total) by mouth every 6 (six) hours. As needed for itching 04/16/14   Daphine Deutscher, Mary-Margaret, FNP  ibuprofen (ADVIL,MOTRIN) 600 MG tablet Take 1 tablet (600 mg total) by mouth every 6 (six) hours as needed. 10/20/16   Audry Pili, PA-C  methocarbamol (ROBAXIN) 500 MG tablet Take 1 tablet (500 mg  total) by mouth 2 (two) times daily. 10/20/16   Audry Pili, PA-C  naproxen sodium (ANAPROX) 220 MG tablet Take 220 mg by mouth 2 (two) times daily with a meal.      [provider]  triamcinolone cream (KENALOG) 0.1 % Apply 1 application topically 3 (three) times daily. 03/30/14   Pauline Aus, PA-C    Family History History reviewed. No pertinent family history.  Social History Social History   Tobacco Use  . Smoking status: Current Every Day Smoker    Packs/day: 1.50  . Smokeless tobacco: Never Used  Substance Use Topics  . Alcohol use: Yes    Comment: OCCASIONAL  . Drug use: Not on file     Allergies   Amoxicillin; Doxycycline; and Penicillins   Review of Systems Review of Systems Ten systems reviewed and are negative for acute change, except as noted in the HPI.    Physical Exam Updated Vital Signs BP 133/90   Pulse 77   Temp 98.4 F (36.9 C) (Oral)   Resp 16   SpO2 100%   Physical Exam  Constitutional: He appears well-developed and well-nourished. No distress.  HENT:  Head: Normocephalic and atraumatic.  Eyes: Conjunctivae are normal. No scleral icterus.  Neck: Normal range of motion. Neck supple.  Cardiovascular: Normal  rate, regular rhythm, normal heart sounds, intact distal pulses and normal pulses.  Pulmonary/Chest: Effort normal and breath sounds normal. No respiratory distress.  Abdominal: Soft. There is no tenderness.  Musculoskeletal: He exhibits no edema.  Neurological: He is alert.  Skin: Skin is warm and dry. He is not diaphoretic.  Psychiatric: His behavior is normal.  Nursing note and vitals reviewed.    ED Treatments / Results  Labs (all labs ordered are listed, but only abnormal results are displayed) Labs Reviewed  D-DIMER, QUANTITATIVE (NOT AT [facility]) - Abnormal; Notable for the following components:      Result Value   D-Dimer, Quant 1.12 (*)    All other components within normal limits  BASIC METABOLIC PANEL  CBC    I-STAT TROPONIN, ED  I-STAT TROPONIN, ED    EKG  EKG Interpretation  Date/Time:  Tuesday August 10 2017 15:33:40 EST Ventricular Rate:  95 PR Interval:  156 QRS Duration: 82 QT Interval:  336 QTC Calculation: 422 R Axis:   84 Text Interpretation:   Poor data quality, interpretation may be adversely affected Normal sinus rhythm with sinus arrhythmia Normal ECG When compared with ECG of 08/27/2011, No significant change was found Confirmed by Dione BoozeGlick, David ([id number]) on 08/10/2017 3:41:30 PM       Radiology Dg Chest 2 View  Result Date: 08/10/2017 CLINICAL DATA:  Central chest tightness radiating into the left arm. EXAM: CHEST  2 VIEW COMPARISON:  Chest CT 09/05/2015 and radiographs 09/27/2014 FINDINGS: The cardiomediastinal silhouette is within normal limits. The lungs are well inflated without evidence of airspace consolidation, edema, pleural effusion, pneumothorax. Mild bullous change and mild pleural thickening are noted in the lung apices. No acute osseous abnormality is seen. IMPRESSION: No active cardiopulmonary disease. Electronically Signed   By: Sebastian AcheAllen  Grady M.D.   On: 08/10/2017 17:10   Ct Angio Chest Pe W And/or Wo Contrast  Result Date: 08/10/2017 CLINICAL DATA:  Central chest pain and shortness of breath beginning this morning. EXAM: CT ANGIOGRAPHY CHEST WITH CONTRAST TECHNIQUE: Multidetector CT imaging of the chest was performed using the standard protocol during bolus administration of intravenous contrast. Multiplanar CT image reconstructions and MIPs were obtained to evaluate the vascular anatomy. CONTRAST:  100mL ISOVUE-370 IOPAMIDOL (ISOVUE-370) INJECTION 76% COMPARISON:  09/05/2015 FINDINGS: Cardiovascular: Good opacification of the central and segmental pulmonary arteries. No focal filling defects. No evidence of significant pulmonary embolus. Normal heart size. No pericardial effusions. Normal caliber thoracic aorta. No evidence of dissection. Great vessel origins are  patent. Mediastinum/Nodes: No significant lymphadenopathy in the chest. Esophagus is mostly decompressed but in the upper thoracic esophagus, there is a hyperdense filling defect. This likely represents ingested material, possibly opaque medication. A calcified mass would be less likely. No similar changes were present on the previous study. Persistence of material in an otherwise decompressed esophagus suggests dysmotility. Lungs/Pleura: Emphysematous changes in the lung apices. Lungs are otherwise clear and expanded. No consolidation or airspace disease. No pleural effusions. No pneumothorax. Airways are patent. Upper Abdomen: No acute abnormality. Musculoskeletal: No chest wall abnormality. No acute or significant osseous findings. Review of the MIP images confirms the above findings. IMPRESSION: 1. No evidence of significant pulmonary embolus. 2. No evidence of active pulmonary disease. Emphysematous changes in the lung apices. 3. Hyperdense material demonstrated in the upper thoracic esophagus likely to represent ingested substance. Consider esophageal dysmotility. Emphysema (ICD10-J43.9). Electronically Signed   By: Burman NievesWilliam  Stevens M.D.   On: 08/10/2017 21:36    Procedures Procedures (including  critical care time)  Medications Ordered in ED Medications  sodium chloride 0.9 % bolus 500 mL (500 mLs Intravenous New Bag/Given 08/10/17 2043)  iopamidol (ISOVUE-370) 76 % injection (100 mLs  Contrast Given 08/10/17 2109)     Initial Impression / Assessment and Plan / ED Course  I have reviewed the triage vital signs and the nursing notes.  Pertinent labs & imaging results that were available during my care of the patient were reviewed by me and considered in my medical decision making (see chart for details).     Patient with heart score of 2 making him low reticulocyte risk for major adverse cardiac events.  D-dimer elevated with elevated heart rate at initial vitals.  His CT angios negative for  pulmonary embolus.  Patient is noted to have early emphysematous changes which I discussed with the patient. The patient was counseled on the dangers of tobacco use, and was advised to quit.  Reviewed strategies to maximize success, including removing cigarettes and smoking materials from environment, stress management, substitution of other forms of reinforcement, support of family/friends, written materials and pharmacotherapy (with pcp mgmt).  Final Clinical Impressions(s) / ED Diagnoses   Final diagnoses:  Chest pain, unspecified type  Tobacco abuse  Other emphysema [facility](HCC)    ED Discharge Orders    None       Arthor CaptainHarris, Brailen Macneal, PA-C 08/11/17 0103    Palumbo, April, MD 08/11/17 0119

## 2017-08-10 NOTE — Discharge Instructions (Signed)

## 2017-09-08 DIAGNOSIS — F418 Other specified anxiety disorders: Secondary | ICD-10-CM | POA: Diagnosis not present

## 2017-09-08 DIAGNOSIS — Z0000 Encounter for general adult medical examination without abnormal findings: Secondary | ICD-10-CM | POA: Diagnosis not present

## 2017-09-10 DIAGNOSIS — J328 Other chronic sinusitis: Secondary | ICD-10-CM | POA: Diagnosis not present

## 2017-09-10 DIAGNOSIS — Z23 Encounter for immunization: Secondary | ICD-10-CM | POA: Diagnosis not present

## 2017-09-10 DIAGNOSIS — Z0000 Encounter for general adult medical examination without abnormal findings: Secondary | ICD-10-CM | POA: Diagnosis not present

## 2017-09-10 DIAGNOSIS — M5416 Radiculopathy, lumbar region: Secondary | ICD-10-CM | POA: Diagnosis not present

## 2017-09-10 DIAGNOSIS — F418 Other specified anxiety disorders: Secondary | ICD-10-CM | POA: Diagnosis not present

## 2017-09-10 DIAGNOSIS — F17200 Nicotine dependence, unspecified, uncomplicated: Secondary | ICD-10-CM | POA: Diagnosis not present

## 2017-09-10 DIAGNOSIS — Z1389 Encounter for screening for other disorder: Secondary | ICD-10-CM | POA: Diagnosis not present

## 2018-07-12 DIAGNOSIS — M791 Myalgia, unspecified site: Secondary | ICD-10-CM | POA: Diagnosis not present

## 2018-07-12 DIAGNOSIS — R509 Fever, unspecified: Secondary | ICD-10-CM | POA: Diagnosis not present

## 2018-07-12 DIAGNOSIS — J029 Acute pharyngitis, unspecified: Secondary | ICD-10-CM | POA: Diagnosis not present

## 2018-07-12 DIAGNOSIS — R0789 Other chest pain: Secondary | ICD-10-CM | POA: Diagnosis not present

## 2018-12-14 DIAGNOSIS — R5383 Other fatigue: Secondary | ICD-10-CM | POA: Diagnosis not present

## 2018-12-14 DIAGNOSIS — R509 Fever, unspecified: Secondary | ICD-10-CM | POA: Diagnosis not present

## 2018-12-14 DIAGNOSIS — J020 Streptococcal pharyngitis: Secondary | ICD-10-CM | POA: Diagnosis not present

## 2018-12-14 DIAGNOSIS — Z0389 Encounter for observation for other suspected diseases and conditions ruled out: Secondary | ICD-10-CM | POA: Diagnosis not present

## 2018-12-14 DIAGNOSIS — Z20828 Contact with and (suspected) exposure to other viral communicable diseases: Secondary | ICD-10-CM | POA: Diagnosis not present

## 2018-12-14 DIAGNOSIS — R05 Cough: Secondary | ICD-10-CM | POA: Diagnosis not present

## 2018-12-14 DIAGNOSIS — R0602 Shortness of breath: Secondary | ICD-10-CM | POA: Diagnosis not present

## 2019-07-11 IMAGING — CT CT ANGIO CHEST
1 of 3 series · 5 of 32 positions shown · IV contrast (APPLIED)
Comparison: 09/05/2015

CLINICAL DATA: Central chest pain and shortness of breath beginning
this morning.

EXAM:
CT ANGIOGRAPHY CHEST WITH CONTRAST
TECHNIQUE: Multidetector CT imaging of the chest was performed using the
standard protocol during bolus administration of intravenous
contrast. Multiplanar CT image reconstructions and MIPs were
obtained to evaluate the vascular anatomy.
CONTRAST:  100mL 1GD8QQ-DJE IOPAMIDOL (1GD8QQ-DJE) INJECTION 76%

[Series 1015: virtual unenhanced vnc mpr tra 5mm range (auto) · axial · 0.41mm/px · z∈[-261,-36]mm · 5 of 65 slices shown]
[im 10/65  lung]
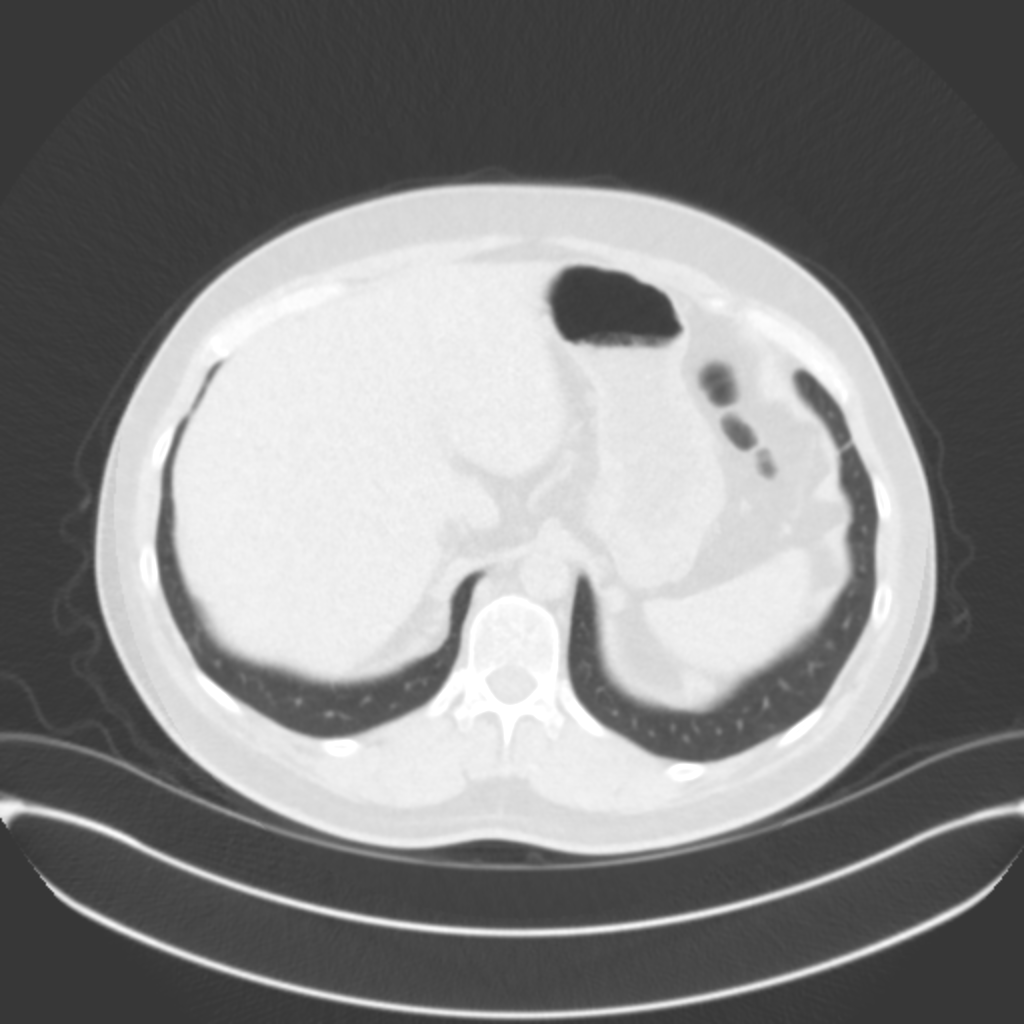
[im 20/65  mediastinal]
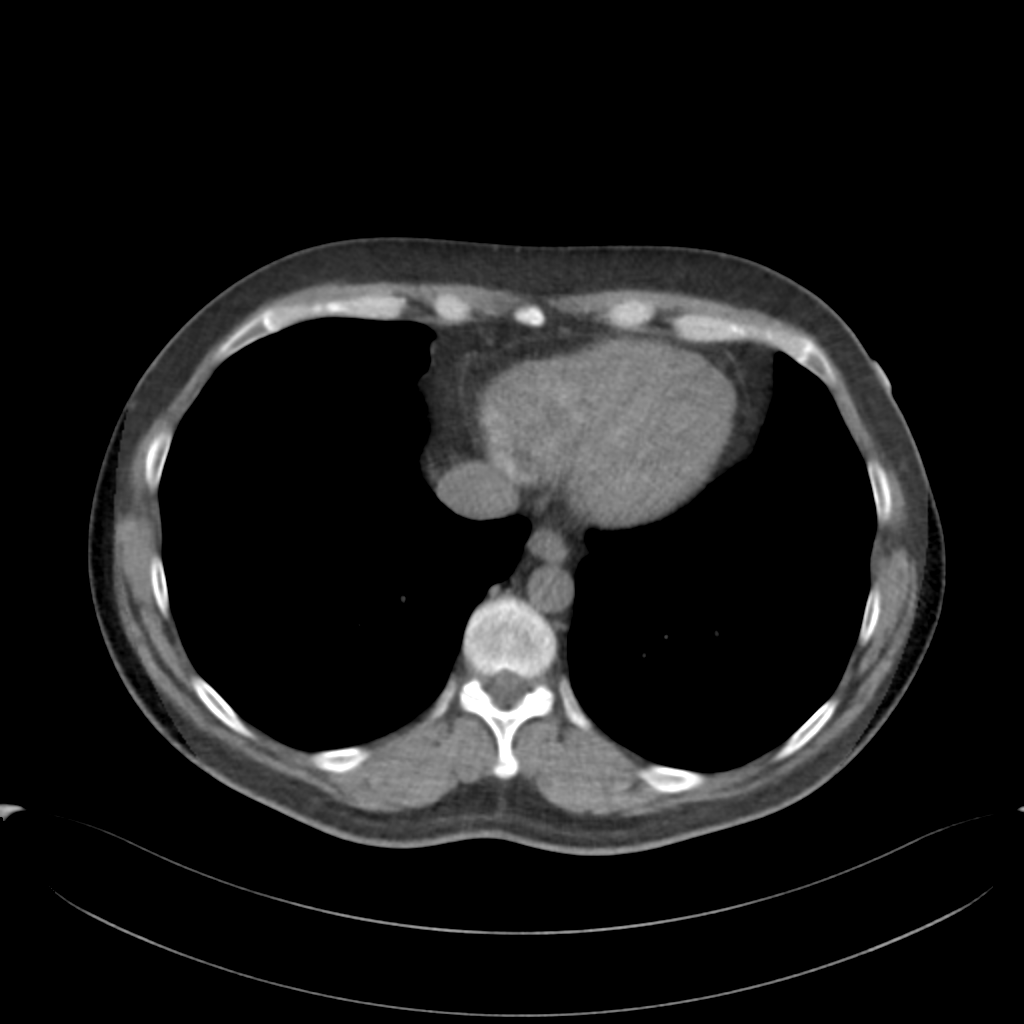
[im 32/65  lung]
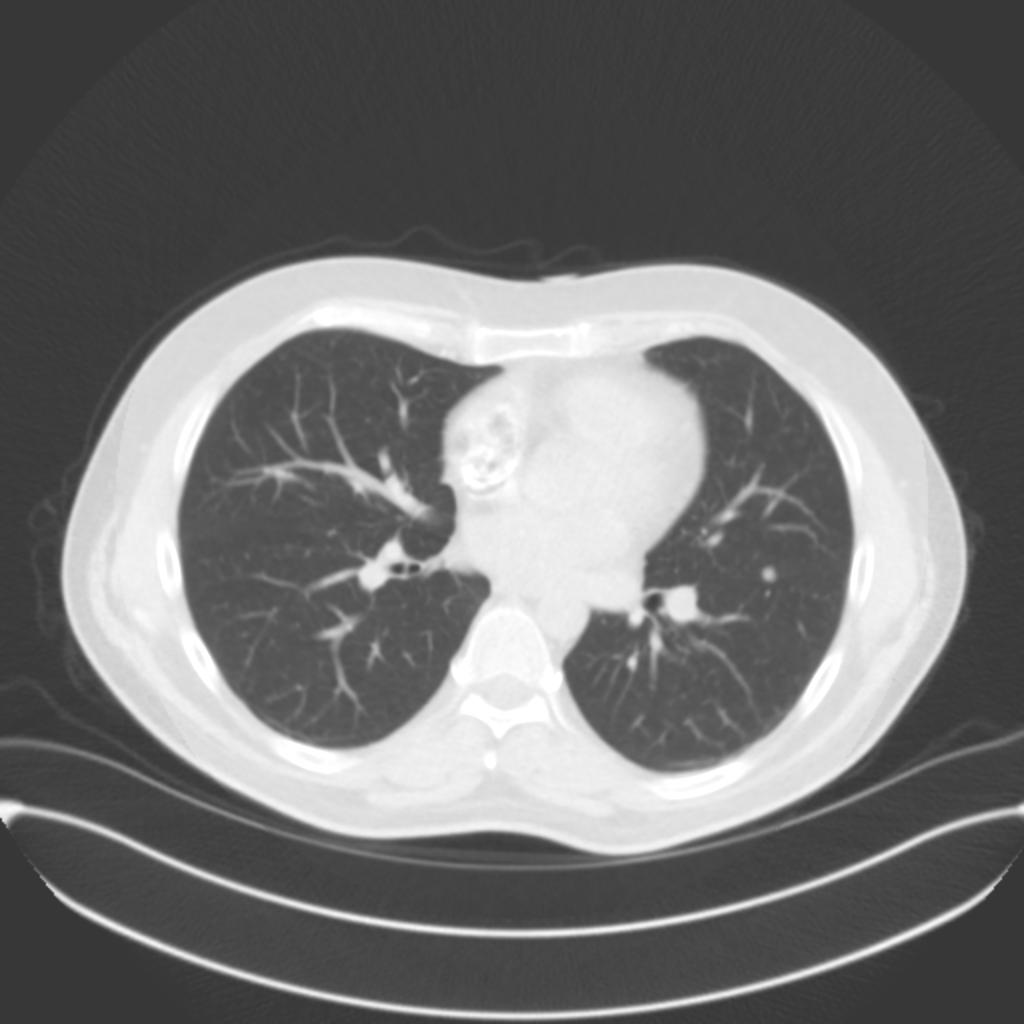
[im 45/65  mediastinal]
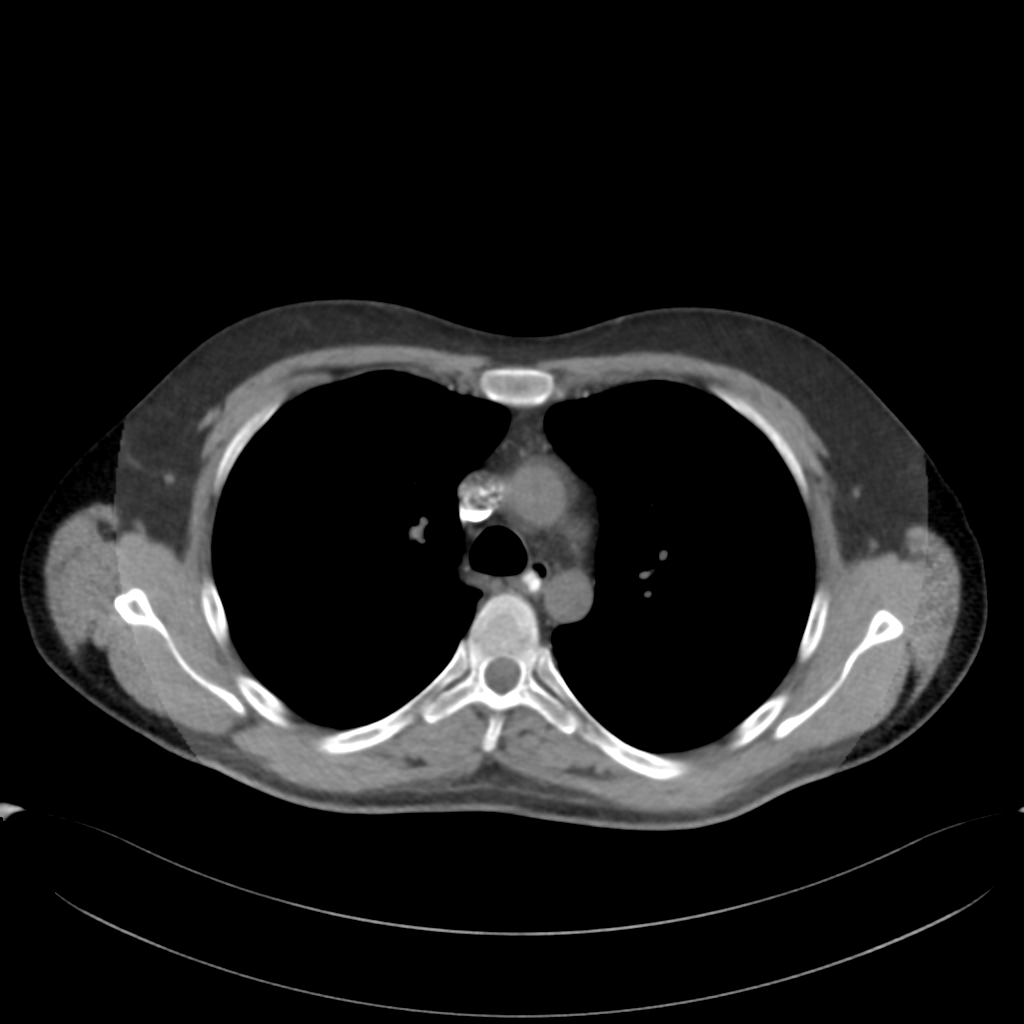
[im 55/65  lung]
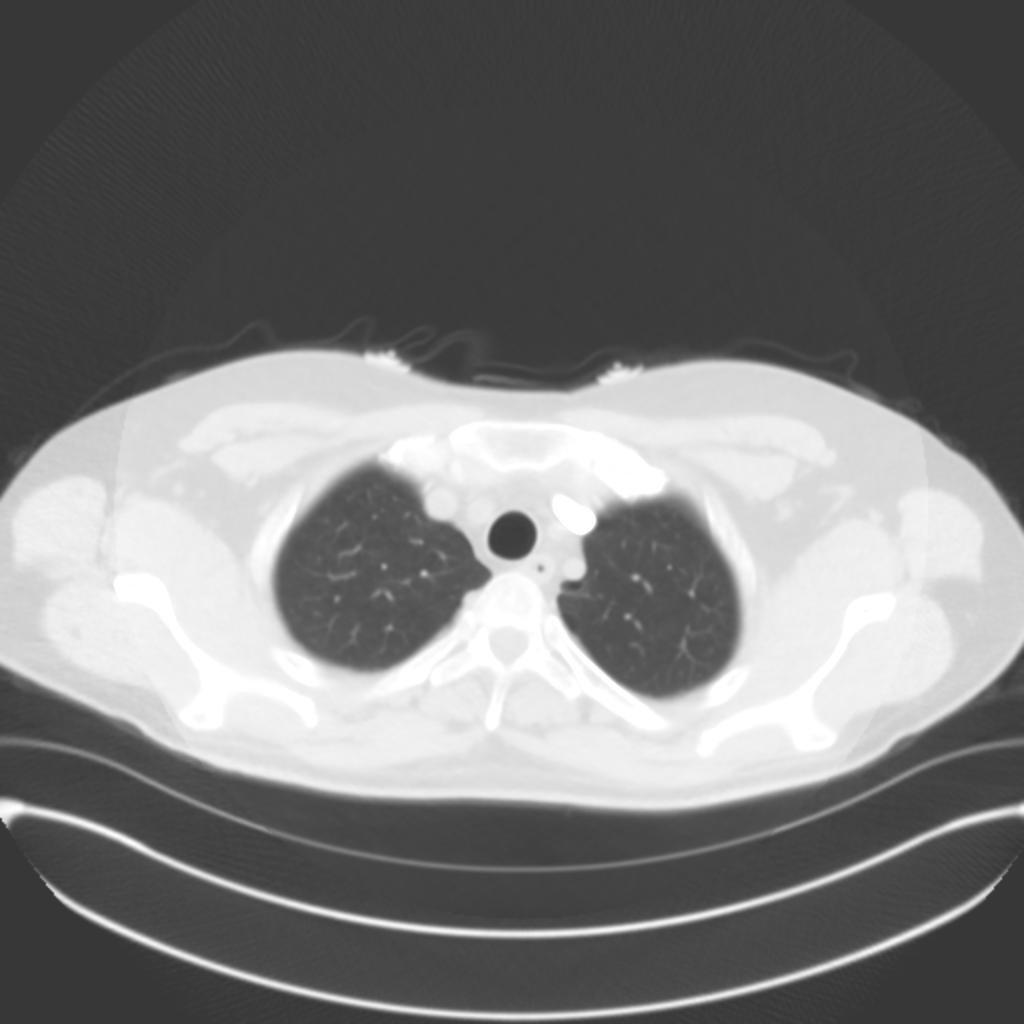

[5 of 32 positions shown; findings below may reference images not displayed]

FINDINGS: Cardiovascular: Good opacification of the central and segmental
pulmonary arteries. No focal filling defects. No evidence of
significant pulmonary embolus. Normal heart size. No pericardial
effusions. Normal caliber thoracic aorta. No evidence of dissection.
Great vessel origins are patent.

Mediastinum/Nodes: No significant lymphadenopathy in the chest.
Esophagus is mostly decompressed but in the upper thoracic
esophagus, there is a hyperdense filling defect. This likely
represents ingested material, possibly opaque medication. A
calcified mass would be less likely. No similar changes were present
on the previous study. Persistence of material in an otherwise
decompressed esophagus suggests dysmotility.

Lungs/Pleura: Emphysematous changes in the lung apices. Lungs are
otherwise clear and expanded. No consolidation or airspace disease.
No pleural effusions. No pneumothorax. Airways are patent.

Upper Abdomen: No acute abnormality.

Musculoskeletal: No chest wall abnormality. No acute or significant
osseous findings.

Review of the MIP images confirms the above findings.
IMPRESSION: 1. No evidence of significant pulmonary embolus.
2. No evidence of active pulmonary disease. Emphysematous changes in
the lung apices.
3. Hyperdense material demonstrated in the upper thoracic esophagus
likely to represent ingested substance. Consider esophageal
dysmotility.

Emphysema ([id number]).

## 2019-07-12 ENCOUNTER — Other Ambulatory Visit: Payer: Self-pay

## 2019-07-12 DIAGNOSIS — F419 Anxiety disorder, unspecified: Secondary | ICD-10-CM | POA: Diagnosis not present

## 2019-07-12 DIAGNOSIS — R634 Abnormal weight loss: Secondary | ICD-10-CM | POA: Diagnosis not present

## 2019-07-12 DIAGNOSIS — Z20828 Contact with and (suspected) exposure to other viral communicable diseases: Secondary | ICD-10-CM | POA: Diagnosis not present

## 2019-07-12 DIAGNOSIS — Z20822 Contact with and (suspected) exposure to covid-19: Secondary | ICD-10-CM

## 2019-07-14 LAB — NOVEL CORONAVIRUS, NAA: SARS-CoV-2, NAA: NOT DETECTED

## 2019-07-25 DIAGNOSIS — Z0000 Encounter for general adult medical examination without abnormal findings: Secondary | ICD-10-CM | POA: Diagnosis not present

## 2019-07-25 DIAGNOSIS — R5383 Other fatigue: Secondary | ICD-10-CM | POA: Diagnosis not present

## 2019-07-25 DIAGNOSIS — R634 Abnormal weight loss: Secondary | ICD-10-CM | POA: Diagnosis not present

## 2019-08-15 DIAGNOSIS — R634 Abnormal weight loss: Secondary | ICD-10-CM | POA: Diagnosis not present

## 2019-08-15 DIAGNOSIS — R5383 Other fatigue: Secondary | ICD-10-CM | POA: Diagnosis not present

## 2019-08-15 DIAGNOSIS — F17200 Nicotine dependence, unspecified, uncomplicated: Secondary | ICD-10-CM | POA: Diagnosis not present

## 2019-08-15 DIAGNOSIS — R358 Other polyuria: Secondary | ICD-10-CM | POA: Diagnosis not present

## 2019-08-15 DIAGNOSIS — Z0000 Encounter for general adult medical examination without abnormal findings: Secondary | ICD-10-CM | POA: Diagnosis not present

## 2020-05-10 DIAGNOSIS — F17200 Nicotine dependence, unspecified, uncomplicated: Secondary | ICD-10-CM | POA: Diagnosis not present

## 2020-05-10 DIAGNOSIS — Z825 Family history of asthma and other chronic lower respiratory diseases: Secondary | ICD-10-CM | POA: Diagnosis not present

## 2020-05-10 DIAGNOSIS — R079 Chest pain, unspecified: Secondary | ICD-10-CM | POA: Diagnosis not present

## 2020-05-10 DIAGNOSIS — R05 Cough: Secondary | ICD-10-CM | POA: Diagnosis not present

## 2020-05-10 DIAGNOSIS — I10 Essential (primary) hypertension: Secondary | ICD-10-CM | POA: Diagnosis not present

## 2020-05-10 DIAGNOSIS — Z20822 Contact with and (suspected) exposure to covid-19: Secondary | ICD-10-CM | POA: Diagnosis not present

## 2020-05-13 DIAGNOSIS — Z20822 Contact with and (suspected) exposure to covid-19: Secondary | ICD-10-CM | POA: Diagnosis not present

## 2020-05-13 DIAGNOSIS — Z03818 Encounter for observation for suspected exposure to other biological agents ruled out: Secondary | ICD-10-CM | POA: Diagnosis not present

## 2020-06-03 DIAGNOSIS — R519 Headache, unspecified: Secondary | ICD-10-CM | POA: Diagnosis not present

## 2020-06-03 DIAGNOSIS — Z20822 Contact with and (suspected) exposure to covid-19: Secondary | ICD-10-CM | POA: Diagnosis not present

## 2020-06-03 DIAGNOSIS — R509 Fever, unspecified: Secondary | ICD-10-CM | POA: Diagnosis not present

## 2020-06-03 DIAGNOSIS — Z6823 Body mass index (BMI) 23.0-23.9, adult: Secondary | ICD-10-CM | POA: Diagnosis not present

## 2020-06-05 DIAGNOSIS — U071 COVID-19: Secondary | ICD-10-CM | POA: Diagnosis not present

## 2020-06-05 DIAGNOSIS — R05 Cough: Secondary | ICD-10-CM | POA: Diagnosis not present

## 2020-06-05 DIAGNOSIS — Z1152 Encounter for screening for COVID-19: Secondary | ICD-10-CM | POA: Diagnosis not present

## 2020-06-07 ENCOUNTER — Other Ambulatory Visit (HOSPITAL_COMMUNITY): Payer: Self-pay | Admitting: Nurse Practitioner

## 2020-06-07 DIAGNOSIS — J984 Other disorders of lung: Secondary | ICD-10-CM

## 2020-06-07 DIAGNOSIS — U071 COVID-19: Secondary | ICD-10-CM

## 2020-06-07 DIAGNOSIS — Z720 Tobacco use: Secondary | ICD-10-CM

## 2020-06-07 NOTE — Progress Notes (Signed)
I connected by phone with [name] Russell on 06/07/2020 at 2:43 PM to discuss [name] potential use of a new treatment for mild to moderate COVID-19 viral infection in non-hospitalized patients.  This Russell is a 39 y.o. male that meets [name] FDA criteria for Emergency Use Authorization of COVID monoclonal antibody casirivimab/imdevimab.  Has a (+) direct SARS-CoV-2 viral test result  Has mild or moderate COVID-19   Is NOT hospitalized due to COVID-19  Is within 10 days of symptom onset  Has at least one of [name] high risk factor(s) for progression to severe COVID-19 and/or hospitalization as defined in EUA.  Specific high risk criteria : Chronic Lung Disease and Other high risk medical condition per CDC:  tobacco use   I have spoken and communicated [name] following to [name] Russell or parent/caregiver regarding COVID monoclonal antibody treatment:  1. FDA has authorized [name] emergency use for [name] treatment of mild to moderate COVID-19 in adults and pediatric patients with positive results of direct SARS-CoV-2 viral testing who are 47 years of age and older weighing at least 40 kg, and who are at high risk for progressing to severe COVID-19 and/or hospitalization.  2. [name] significant known and potential risks and benefits of COVID monoclonal antibody, and [name] extent to which such potential risks and benefits are unknown.  3. Information on available alternative treatments and [name] risks and benefits of those alternatives, including clinical trials.  4. Patients treated with COVID monoclonal antibody should continue to self-isolate and use infection control measures (e.g., wear mask, isolate, social distance, avoid sharing personal items, clean and disinfect "high touch" surfaces, and frequent handwashing) according to CDC guidelines.   5. [name] Russell or parent/caregiver has [name] option to accept or refuse COVID monoclonal antibody treatment.  After reviewing this information with [name] Russell, [name] Russell  agreed to proceed with receiving casirivimab\imdevimab infusion and will be provided a copy of [name] Fact sheet prior to receiving [name] infusion. Fabian November Elodia Florence 06/07/2020 2:43 PM

## 2020-06-08 ENCOUNTER — Ambulatory Visit (HOSPITAL_COMMUNITY)
Admission: RE | Admit: 2020-06-08 | Discharge: 2020-06-08 | Disposition: A | Discharge status: Home / Self Care | Payer: BC Managed Care – PPO | Source: Ambulatory Visit | Attending: Pulmonary Disease | Admitting: Pulmonary Disease

## 2020-06-08 ENCOUNTER — Other Ambulatory Visit (HOSPITAL_COMMUNITY): Payer: Self-pay

## 2020-06-08 DIAGNOSIS — Z720 Tobacco use: Secondary | ICD-10-CM

## 2020-06-08 DIAGNOSIS — U071 COVID-19: Secondary | ICD-10-CM

## 2020-06-08 DIAGNOSIS — J984 Other disorders of lung: Secondary | ICD-10-CM

## 2020-06-08 MED ORDER — EPINEPHRINE 0.3 MG/0.3ML IJ SOAJ: 0.3000 mg | Freq: Once | INTRAMUSCULAR | Status: DC | PRN

## 2020-06-08 MED ORDER — ALBUTEROL SULFATE HFA 108 (90 BASE) MCG/ACT IN AERS: 2.0000 | INHALATION_SPRAY | Freq: Once | RESPIRATORY_TRACT | Status: DC | PRN

## 2020-06-08 MED ORDER — SODIUM CHLORIDE 0.9 % IV SOLN
1200.0000 mg | Freq: Once | INTRAVENOUS | Status: AC
  Administered 2020-06-08: 1200 mg via INTRAVENOUS

## 2020-06-08 MED ORDER — SODIUM CHLORIDE 0.9 % IV SOLN: INTRAVENOUS | Status: DC | PRN

## 2020-06-08 MED ORDER — DIPHENHYDRAMINE HCL 50 MG/ML IJ SOLN: 50.0000 mg | Freq: Once | INTRAMUSCULAR | Status: DC | PRN

## 2020-06-08 MED ORDER — FAMOTIDINE IN NACL 20-0.9 MG/50ML-% IV SOLN: 20.0000 mg | Freq: Once | INTRAVENOUS | Status: DC | PRN

## 2020-06-08 MED ORDER — METHYLPREDNISOLONE SODIUM SUCC 125 MG IJ SOLR: 125.0000 mg | Freq: Once | INTRAMUSCULAR | Status: DC | PRN

## 2020-06-08 NOTE — Discharge Instructions (Signed)

## 2020-06-08 NOTE — Progress Notes (Signed)
  Diagnosis: COVID-19  Physician:Dr Wright   Procedure: [facility] Med: casirivimab\imdevimab infusion - Provided patient with casirivimab\imdevimab fact sheet for patients, parents and caregivers prior to infusion.  Complications: No immediate complications noted.  Discharge: Discharged home   [name] Russell 06/08/2020  

## 2021-03-20 DIAGNOSIS — Z7689 Persons encountering health services in other specified circumstances: Secondary | ICD-10-CM | POA: Diagnosis not present

## 2021-03-20 DIAGNOSIS — F410 Panic disorder [episodic paroxysmal anxiety] without agoraphobia: Secondary | ICD-10-CM | POA: Diagnosis not present

## 2021-03-20 DIAGNOSIS — Z716 Tobacco abuse counseling: Secondary | ICD-10-CM | POA: Diagnosis not present

## 2021-03-20 DIAGNOSIS — F411 Generalized anxiety disorder: Secondary | ICD-10-CM | POA: Diagnosis not present

## 2021-06-06 DIAGNOSIS — Z0000 Encounter for general adult medical examination without abnormal findings: Secondary | ICD-10-CM | POA: Diagnosis not present

## 2021-06-11 DIAGNOSIS — K219 Gastro-esophageal reflux disease without esophagitis: Secondary | ICD-10-CM | POA: Diagnosis not present

## 2021-06-11 DIAGNOSIS — Z716 Tobacco abuse counseling: Secondary | ICD-10-CM | POA: Diagnosis not present

## 2021-06-11 DIAGNOSIS — Z1159 Encounter for screening for other viral diseases: Secondary | ICD-10-CM | POA: Diagnosis not present

## 2021-06-11 DIAGNOSIS — Z23 Encounter for immunization: Secondary | ICD-10-CM | POA: Diagnosis not present

## 2021-06-11 DIAGNOSIS — Z2821 Immunization not carried out because of patient refusal: Secondary | ICD-10-CM | POA: Diagnosis not present

## 2021-06-11 DIAGNOSIS — B070 Plantar wart: Secondary | ICD-10-CM | POA: Diagnosis not present

## 2021-06-11 DIAGNOSIS — Z0000 Encounter for general adult medical examination without abnormal findings: Secondary | ICD-10-CM | POA: Diagnosis not present

## 2021-06-11 DIAGNOSIS — Z7189 Other specified counseling: Secondary | ICD-10-CM | POA: Diagnosis not present

## 2021-06-11 DIAGNOSIS — H16139 Photokeratitis, unspecified eye: Secondary | ICD-10-CM | POA: Diagnosis not present

## 2021-06-17 DIAGNOSIS — G4700 Insomnia, unspecified: Secondary | ICD-10-CM | POA: Diagnosis not present

## 2021-06-17 DIAGNOSIS — F331 Major depressive disorder, recurrent, moderate: Secondary | ICD-10-CM | POA: Diagnosis not present

## 2021-06-17 DIAGNOSIS — F411 Generalized anxiety disorder: Secondary | ICD-10-CM | POA: Diagnosis not present

## 2021-08-12 DIAGNOSIS — Z23 Encounter for immunization: Secondary | ICD-10-CM | POA: Diagnosis not present

## 2021-10-06 DIAGNOSIS — G4700 Insomnia, unspecified: Secondary | ICD-10-CM | POA: Diagnosis not present

## 2021-10-06 DIAGNOSIS — F411 Generalized anxiety disorder: Secondary | ICD-10-CM | POA: Diagnosis not present

## 2021-10-06 DIAGNOSIS — F331 Major depressive disorder, recurrent, moderate: Secondary | ICD-10-CM | POA: Diagnosis not present

## 2021-11-03 DIAGNOSIS — K062 Gingival and edentulous alveolar ridge lesions associated with trauma: Secondary | ICD-10-CM | POA: Diagnosis not present

## 2021-11-03 DIAGNOSIS — F411 Generalized anxiety disorder: Secondary | ICD-10-CM | POA: Diagnosis not present

## 2021-11-03 DIAGNOSIS — G4700 Insomnia, unspecified: Secondary | ICD-10-CM | POA: Diagnosis not present

## 2022-03-02 ENCOUNTER — Ambulatory Visit (INDEPENDENT_AMBULATORY_CARE_PROVIDER_SITE_OTHER): Payer: BC Managed Care – PPO

## 2022-03-02 ENCOUNTER — Telehealth: Payer: Self-pay

## 2022-03-02 ENCOUNTER — Ambulatory Visit
Admission: EM | Admit: 2022-03-02 | Discharge: 2022-03-02 | Disposition: A | Discharge status: Home / Self Care | Payer: BC Managed Care – PPO | Attending: Nurse Practitioner | Admitting: Nurse Practitioner

## 2022-03-02 VITALS — BP 120/80 | HR 89 | Temp 99.3°F | Resp 20

## 2022-03-02 DIAGNOSIS — R059 Cough, unspecified: Secondary | ICD-10-CM | POA: Diagnosis not present

## 2022-03-02 DIAGNOSIS — J189 Pneumonia, unspecified organism: Secondary | ICD-10-CM | POA: Diagnosis not present

## 2022-03-02 DIAGNOSIS — R509 Fever, unspecified: Secondary | ICD-10-CM

## 2022-03-02 MED ORDER — PROMETHAZINE-DM 6.25-15 MG/5ML PO SYRP: 5.0000 mL | ORAL_SOLUTION | Freq: Every evening | ORAL | 0 refills | Status: DC | PRN

## 2022-03-02 MED ORDER — SALINE SPRAY 0.65 % NA SOLN: 1.0000 | NASAL | 0 refills | Status: AC | PRN

## 2022-03-02 MED ORDER — AZITHROMYCIN 250 MG PO TABS: 250.0000 mg | ORAL_TABLET | Freq: Every day | ORAL | 0 refills | Status: DC

## 2022-03-02 MED ORDER — BENZONATATE 100 MG PO CAPS: 100.0000 mg | ORAL_CAPSULE | Freq: Three times a day (TID) | ORAL | 0 refills | Status: DC | PRN

## 2022-03-02 MED ORDER — PROMETHAZINE-DM 6.25-15 MG/5ML PO SYRP: 5.0000 mL | ORAL_SOLUTION | Freq: Every evening | ORAL | 0 refills | Status: AC | PRN

## 2022-03-02 NOTE — ED Provider Notes (Signed)
[facility]    CSN: [id number] Arrival date & time: 03/02/22  0946      History   Chief Complaint Chief Complaint  Patient presents with   Cough    Entered by patient   Appointment    10:00    HPI [name] Russell is a 41 y.o. male.   Patient presents today with roughly 10-day history of cough, congestion, sinus pressure, ear pain.  Reports symptoms have not been improving.  Denies chest pain, shortness of breath at rest, wheezing, chest tightness.  Endorses some shortness of breath with activity, decreased appetite, nausea and vomiting.  He has been a lot more tired than normal and not able to function at his normal level.  He has been taking over-the-counter medications which have helped minimally.  Medical history significant for left pneumothorax more than 10 years ago.  He does smoke cigarettes.  No recent antibiotic use, reports anaphylactic reaction to penicillin antibiotics.  Doxycycline made him break out in a rash.     Past Medical History:  Diagnosis Date   Anxiety    Migraine    Pneumothorax    Radial nerve palsy     Patient Active Problem List   Diagnosis Date Noted   Anxiety state, unspecified 03/14/2014    Past Surgical History:  Procedure Laterality Date   THORACOTOMY         Home Medications    Prior to Admission medications   Medication Sig Start Date End Date Taking? Authorizing Provider  benzonatate (TESSALON) 100 MG capsule Take 1 capsule (100 mg total) by mouth 3 (three) times daily as needed for cough. Do not take while driving or operating heavy machinery 03/02/22  Yes Valentino NoseMartinez, Marializ Ferrebee A, NP  promethazine-dextromethorphan (PROMETHAZINE-DM) 6.25-15 MG/5ML syrup Take 5 mLs by mouth at bedtime as needed for cough. Do not take while driving or operating heavy machinery 03/02/22  Yes Cathlean MarseillesMartinez, Kei Langhorst A, NP  sodium chloride (OCEAN) 0.65 % SOLN nasal spray Place 1 spray into both nostrils as needed for congestion. 03/02/22  Yes  Valentino NoseMartinez, Latria Mccarron A, NP  acetaminophen (TYLENOL) 500 MG tablet Take 1,000 mg by mouth every 6 (six) hours as needed. For migraine      [provider]  ALPRAZolam (XANAX) 0.25 MG tablet Take 0.25 mg by mouth daily as needed. For anxiety     [provider]  azithromycin (ZITHROMAX) 250 MG tablet Take 1 tablet (250 mg total) by mouth daily. Take first 2 tablets together, then 1 every day until finished. 03/02/22   Valentino NoseMartinez, Narada Uzzle A, NP  buPROPion [facility](WELLBUTRIN SR) 150 MG 12 hr tablet Take 1 tablet (150 mg total) by mouth 2 (two) times daily. 10/15/14   Daphine DeutscherMartin, Mary-Margaret, FNP  hydrOXYzine (ATARAX/VISTARIL) 25 MG tablet Take 1 tablet (25 mg total) by mouth every 6 (six) hours. As needed for itching 04/16/14   Daphine DeutscherMartin, Mary-Margaret, FNP  ibuprofen (ADVIL,MOTRIN) 600 MG tablet Take 1 tablet (600 mg total) by mouth every 6 (six) hours as needed. 10/20/16   Audry PiliMohr, Tyler, PA-C  methocarbamol (ROBAXIN) 500 MG tablet Take 1 tablet (500 mg total) by mouth 2 (two) times daily. 10/20/16   Audry PiliMohr, Tyler, PA-C  naproxen sodium (ANAPROX) 220 MG tablet Take 220 mg by mouth 2 (two) times daily with a meal.      [provider]  triamcinolone cream (KENALOG) 0.1 % Apply 1 application topically 3 (three) times daily. 03/30/14   Pauline Ausriplett, Tammy, PA-C    Family History History reviewed.  No pertinent family history.  Social History Social History   Tobacco Use   Smoking status: Every Day    Packs/day: 1.50    Types: Cigarettes   Smokeless tobacco: Never  Substance Use Topics   Alcohol use: Yes    Comment: OCCASIONAL     Allergies   Amoxicillin, Doxycycline, and Penicillins   Review of Systems Review of Systems Per HPI  Physical Exam Triage Vital Signs ED Triage Vitals  Enc Vitals Group     BP 03/02/22 1043 120/80     Pulse Rate 03/02/22 1043 89     Resp 03/02/22 1043 20     Temp 03/02/22 1043 99.3 F (37.4 C)     Temp src --      SpO2 03/02/22 1043 95 %     Weight --       Height --      Head Circumference --      Peak Flow --      Pain Score 03/02/22 1040 3     Pain Loc --      Pain Edu? --      Excl. in GC? --    No data found.  Updated Vital Signs BP 120/80   Pulse 89   Temp 99.3 F (37.4 C)   Resp 20   SpO2 95%   Visual Acuity Right Eye Distance:   Left Eye Distance:   Bilateral Distance:    Right Eye Near:   Left Eye Near:    Bilateral Near:     Physical Exam Vitals and nursing note reviewed.  Constitutional:      General: He is not in acute distress.    Appearance: Normal appearance. He is not toxic-appearing.     Comments: Patient is lying down on examination table sleeping when I enter room  HENT:     Head: Normocephalic and atraumatic.     Nose: Congestion and rhinorrhea present.     Mouth/Throat:     Mouth: Mucous membranes are moist.     Pharynx: Oropharynx is clear.  Eyes:     General: No scleral icterus.    Extraocular Movements: Extraocular movements intact.  Cardiovascular:     Rate and Rhythm: Normal rate and regular rhythm.  Pulmonary:     Effort: Pulmonary effort is normal.     Breath sounds: Examination of the right-upper field reveals decreased breath sounds. Examination of the right-middle field reveals decreased breath sounds. Examination of the right-lower field reveals decreased breath sounds. Decreased breath sounds present. No wheezing, rhonchi or rales.  Musculoskeletal:     Cervical back: Normal range of motion.  Lymphadenopathy:     Cervical: No cervical adenopathy.  Skin:    General: Skin is warm and dry.     Capillary Refill: Capillary refill takes less than 2 seconds.     Coloration: Skin is not jaundiced or pale.     Findings: No erythema.  Neurological:     Mental Status: He is alert and oriented to person, place, and time.     Motor: No weakness.     Gait: Gait normal.  Psychiatric:        Behavior: Behavior is cooperative.      UC Treatments / Results  Labs (all labs ordered  are listed, but only abnormal results are displayed) Labs Reviewed - No data to display  EKG   Radiology DG Chest 2 View  Result Date: 03/02/2022 CLINICAL DATA:  Cough and fever. EXAM: CHEST -  2 VIEW COMPARISON:  Two-view chest x-ray.  CTA chest 08/10/2017 FINDINGS: Ill-defined opacity is noted in the right middle lobe. Lungs are otherwise clear. Heart size is normal. Axial skeleton is within normal limits. IMPRESSION: Ill-defined right middle lobe airspace disease concerning for pneumonia. Recommend follow-up two-view chest x-ray to assure clearing. Electronically Signed   By: Marin Roberts M.D.   On: 03/02/2022 11:17    Procedures Procedures (including critical care time)  Medications Ordered in UC Medications - No data to display  Initial Impression / Assessment and Plan / UC Course  I have reviewed the triage vital signs and the nursing notes.  Pertinent labs & imaging results that were available during my care of the patient were reviewed by me and considered in my medical decision making (see chart for details).    Chest x-ray today is concerning for right middle lobe pneumonia.  Given allergy to penicillin and doxycycline, will treat with azithromycin.  Encouraged close follow-up with primary care for repeat x-ray in 4 to 6 weeks to ensure entire resolution.  We also discussed starting saline nasal rinses to help with sinus pressure, cough suppressants as needed, pushing hydration.  Seek care if symptoms persist or worsen despite treatment.  The patient was given the opportunity to ask questions.  All questions answered to their satisfaction.  The patient is in agreement to this plan.   Final Clinical Impressions(s) / UC Diagnoses   Final diagnoses:  Community acquired pneumonia of right middle lobe of lung     Discharge Instructions      - The x-ray shows that you have a spot in the right middle lobe of your lung that looks like pneumonia -Please start the  azithromycin to help treat this; you can use the cough syrup at nighttime and the cough Perles during the day to help with the cough -Please also start nasal saline rinses to help with the sinus pain/pressure -Make sure you are drinking plenty of fluids, also make sure you are over-the-counter medicine has guaifenesin in it to help loosen the congestion -When you follow-up with your primary care provider next week, please mention to them that you had pneumonia so that way they can recheck the x-ray in a few weeks    ED Prescriptions     Medication Sig Dispense Auth. Provider   azithromycin (ZITHROMAX) 250 MG tablet Take 1 tablet (250 mg total) by mouth daily. Take first 2 tablets together, then 1 every day until finished. 6 tablet Cathlean Marseilles A, NP   promethazine-dextromethorphan (PROMETHAZINE-DM) 6.25-15 MG/5ML syrup Take 5 mLs by mouth at bedtime as needed for cough. Do not take while driving or operating heavy machinery 118 mL Cathlean Marseilles A, NP   benzonatate (TESSALON) 100 MG capsule Take 1 capsule (100 mg total) by mouth 3 (three) times daily as needed for cough. Do not take while driving or operating heavy machinery 21 capsule Cathlean Marseilles A, NP   sodium chloride (OCEAN) 0.65 % SOLN nasal spray Place 1 spray into both nostrils as needed for congestion. 88 mL Valentino Nose, NP      PDMP not reviewed this encounter.   Valentino Nose, NP 03/02/22 1233

## 2022-03-02 NOTE — ED Triage Notes (Signed)
Pt presents with c/o cough and facial pain and pressure for over a week

## 2022-03-02 NOTE — Discharge Instructions (Signed)
-   The x-ray shows that you have a spot in the right middle lobe of your lung that looks like pneumonia -Please start the azithromycin to help treat this; you can use the cough syrup at nighttime and the cough Perles during the day to help with the cough -Please also start nasal saline rinses to help with the sinus pain/pressure -Make sure you are drinking plenty of fluids, also make sure you are over-the-counter medicine has guaifenesin in it to help loosen the congestion -When you follow-up with your primary care provider next week, please mention to them that you had pneumonia so that way they can recheck the x-ray in a few weeks

## 2022-03-06 DIAGNOSIS — F331 Major depressive disorder, recurrent, moderate: Secondary | ICD-10-CM | POA: Diagnosis not present

## 2022-03-06 DIAGNOSIS — G4700 Insomnia, unspecified: Secondary | ICD-10-CM | POA: Diagnosis not present

## 2022-03-06 DIAGNOSIS — F411 Generalized anxiety disorder: Secondary | ICD-10-CM | POA: Diagnosis not present

## 2022-03-06 DIAGNOSIS — J189 Pneumonia, unspecified organism: Secondary | ICD-10-CM | POA: Diagnosis not present

## 2022-03-20 ENCOUNTER — Other Ambulatory Visit: Payer: Self-pay

## 2022-03-20 ENCOUNTER — Emergency Department (HOSPITAL_COMMUNITY)
Admission: EM | Admit: 2022-03-20 | Discharge: 2022-03-20 | Disposition: A | Discharge status: Home / Self Care | Payer: BC Managed Care – PPO | Attending: Emergency Medicine | Admitting: Emergency Medicine

## 2022-03-20 ENCOUNTER — Emergency Department (HOSPITAL_COMMUNITY): Payer: BC Managed Care – PPO

## 2022-03-20 ENCOUNTER — Encounter (HOSPITAL_COMMUNITY): Payer: Self-pay | Admitting: Emergency Medicine

## 2022-03-20 DIAGNOSIS — R051 Acute cough: Secondary | ICD-10-CM | POA: Diagnosis not present

## 2022-03-20 DIAGNOSIS — Z20822 Contact with and (suspected) exposure to covid-19: Secondary | ICD-10-CM | POA: Diagnosis not present

## 2022-03-20 DIAGNOSIS — R0602 Shortness of breath: Secondary | ICD-10-CM | POA: Diagnosis not present

## 2022-03-20 DIAGNOSIS — R059 Cough, unspecified: Secondary | ICD-10-CM | POA: Diagnosis not present

## 2022-03-20 LAB — SARS CORONAVIRUS 2 BY RT PCR: SARS Coronavirus 2 by RT PCR: NEGATIVE

## 2022-03-20 MED ORDER — METHYLPREDNISOLONE SODIUM SUCC 125 MG IJ SOLR
125.0000 mg | Freq: Once | INTRAMUSCULAR | Status: AC
  Administered 2022-03-20: 125 mg via INTRAMUSCULAR
  Filled 2022-03-20: qty 2

## 2022-03-20 NOTE — ED Provider Notes (Signed)
[facility] EMERGENCY DEPARTMENT Provider Note   CSN: [id number] Arrival date & time: 03/20/22  2016     History  Chief Complaint  Patient presents with   Cough    [name] Russell is a 41 y.o. male.  Patient reports he has been treated for pneumonia.  Patient was given a prescription for doxycycline however he could not tolerate it.  Dates he has been treated twice with Zithromax.  Patient reports all of his symptoms resolved until last p.m. when he began having fevers and coughing again  The history is provided by the patient. No language interpreter was used.  Cough Cough characteristics:  Productive Sputum characteristics:  Nondescript Severity:  Moderate Onset quality:  Gradual Duration:  3 weeks Timing:  Intermittent Chronicity:  New Smoker: yes   Context: upper respiratory infection   Relieved by:  Nothing Worsened by:  Nothing Associated symptoms: fever and shortness of breath        Home Medications Prior to Admission medications   Medication Sig Start Date End Date Taking? Authorizing Provider  acetaminophen (TYLENOL) 500 MG tablet Take 1,000 mg by mouth every 6 (six) hours as needed. For migraine      [provider]  ALPRAZolam (XANAX) 0.25 MG tablet Take 0.25 mg by mouth daily as needed. For anxiety     [provider]  azithromycin (ZITHROMAX) 250 MG tablet Take 1 tablet (250 mg total) by mouth daily. Take first 2 tablets together, then 1 every day until finished. 03/02/22   Valentino Nose, NP  benzonatate (TESSALON) 100 MG capsule Take 1 capsule (100 mg total) by mouth 3 (three) times daily as needed for cough. Do not take while driving or operating heavy machinery 03/02/22   Valentino Nose, NP  buPROPion [facility] SR) 150 MG 12 hr tablet Take 1 tablet (150 mg total) by mouth 2 (two) times daily. 10/15/14   Daphine Deutscher Mary-Margaret, FNP  hydrOXYzine (ATARAX/VISTARIL) 25 MG tablet Take 1 tablet (25 mg total) by mouth every 6 (six)  hours. As needed for itching 04/16/14   Daphine Deutscher, Mary-Margaret, FNP  ibuprofen (ADVIL,MOTRIN) 600 MG tablet Take 1 tablet (600 mg total) by mouth every 6 (six) hours as needed. 10/20/16   Audry Pili, PA-C  methocarbamol (ROBAXIN) 500 MG tablet Take 1 tablet (500 mg total) by mouth 2 (two) times daily. 10/20/16   Audry Pili, PA-C  naproxen sodium (ANAPROX) 220 MG tablet Take 220 mg by mouth 2 (two) times daily with a meal.      [provider]  promethazine-dextromethorphan (PROMETHAZINE-DM) 6.25-15 MG/5ML syrup Take 5 mLs by mouth at bedtime as needed for cough. Do not take while driving or operating heavy machinery 03/02/22   Valentino Nose, NP  sodium chloride (OCEAN) 0.65 % SOLN nasal spray Place 1 spray into both nostrils as needed for congestion. 03/02/22   Valentino Nose, NP  triamcinolone cream (KENALOG) 0.1 % Apply 1 application topically 3 (three) times daily. 03/30/14   Triplett, Tammy, PA-C      Allergies    Amoxicillin, Doxycycline, and Penicillins    Review of Systems   Review of Systems  Constitutional:  Positive for fever.  Respiratory:  Positive for cough and shortness of breath.   All other systems reviewed and are negative.   Physical Exam Updated Vital Signs BP 124/71 (BP Location: Right Arm)   Pulse 87   Temp 97.7 F (36.5 C) (Oral)   Resp 17   Ht 5\' 9"  (1.753 m)  Wt 72.6 kg   SpO2 97%   BMI 23.63 kg/m  Physical Exam Vitals and nursing note reviewed.  Constitutional:      Appearance: He is well-developed.  HENT:     Head: Normocephalic.  Cardiovascular:     Rate and Rhythm: Normal rate.  Pulmonary:     Effort: Pulmonary effort is normal.  Abdominal:     General: Abdomen is flat. There is no distension.  Musculoskeletal:        General: Normal range of motion.     Cervical back: Normal range of motion.  Skin:    General: Skin is warm.  Neurological:     General: No focal deficit present.     Mental Status: He is alert and oriented to  person, place, and time.  Psychiatric:        Mood and Affect: Mood normal.     ED Results / Procedures / Treatments   Labs (all labs ordered are listed, but only abnormal results are displayed) Labs Reviewed  SARS CORONAVIRUS 2 BY RT PCR    EKG None  Radiology DG Chest 2 View  Result Date: 03/20/2022 CLINICAL DATA:  Cough, shortness of breath EXAM: CHEST - 2 VIEW COMPARISON:  Chest radiograph dated March 02, 2022 FINDINGS: The heart size and mediastinal contours are within normal limits. Both lungs are clear. The visualized skeletal structures are unremarkable. IMPRESSION: No active cardiopulmonary disease. Electronically Signed   By: Larose Hires D.O.   On: 03/20/2022 21:51    Procedures Procedures    Medications Ordered in ED Medications  methylPREDNISolone sodium succinate (SOLU-MEDROL) 125 mg/2 mL injection 125 mg (has no administration in time range)    ED Course/ Medical Decision Making/ A&P                           Medical Decision Making Patient has been treated twice for pneumonia he is concerned that he has infection again  Amount and/or Complexity of Data Reviewed External Data Reviewed: notes.    Details: Urgent care notes reviewed Radiology: ordered and independent interpretation performed. Decision-making details documented in ED Course.    Details: Chest x-ray is normal  Risk OTC drugs. Prescription drug management. Risk Details: I will give patient a injection of Solu-Medrol to try to help with symptoms.  He is advised to continue using his albuterol inhaler he is to follow-up with his primary care physician for recheck return to the emergency department if symptoms worsen or change           Final Clinical Impression(s) / ED Diagnoses Final diagnoses:  Acute cough    Rx / DC Orders ED Discharge Orders     None     An After Visit Summary was printed and given to the patient.     [name] 03/20/22 2243    Terrilee Files, MD 03/21/22 [phone]

## 2022-03-20 NOTE — Discharge Instructions (Addendum)
Continue using albuterol inhaler.  Check your My chart for covid results

## 2022-03-20 NOTE — ED Triage Notes (Signed)
Pt states c/o 3 weeks ago diagnosed with pneumonia and still c/o cough and chest tightness at times. Pt denies chest tightness at this time.

## 2022-05-06 DIAGNOSIS — F331 Major depressive disorder, recurrent, moderate: Secondary | ICD-10-CM | POA: Diagnosis not present

## 2022-05-06 DIAGNOSIS — F410 Panic disorder [episodic paroxysmal anxiety] without agoraphobia: Secondary | ICD-10-CM | POA: Diagnosis not present

## 2022-05-06 DIAGNOSIS — F411 Generalized anxiety disorder: Secondary | ICD-10-CM | POA: Diagnosis not present

## 2022-05-06 DIAGNOSIS — G4700 Insomnia, unspecified: Secondary | ICD-10-CM | POA: Diagnosis not present

## 2022-05-18 DIAGNOSIS — F410 Panic disorder [episodic paroxysmal anxiety] without agoraphobia: Secondary | ICD-10-CM | POA: Diagnosis not present

## 2022-05-18 DIAGNOSIS — F331 Major depressive disorder, recurrent, moderate: Secondary | ICD-10-CM | POA: Diagnosis not present

## 2022-05-18 DIAGNOSIS — F411 Generalized anxiety disorder: Secondary | ICD-10-CM | POA: Diagnosis not present

## 2022-05-18 DIAGNOSIS — G4700 Insomnia, unspecified: Secondary | ICD-10-CM | POA: Diagnosis not present

## 2022-06-12 DIAGNOSIS — Z0000 Encounter for general adult medical examination without abnormal findings: Secondary | ICD-10-CM | POA: Diagnosis not present

## 2022-06-12 DIAGNOSIS — Z114 Encounter for screening for human immunodeficiency virus [HIV]: Secondary | ICD-10-CM | POA: Diagnosis not present

## 2022-06-17 DIAGNOSIS — F411 Generalized anxiety disorder: Secondary | ICD-10-CM | POA: Diagnosis not present

## 2022-06-17 DIAGNOSIS — F331 Major depressive disorder, recurrent, moderate: Secondary | ICD-10-CM | POA: Diagnosis not present

## 2022-06-17 DIAGNOSIS — G4700 Insomnia, unspecified: Secondary | ICD-10-CM | POA: Diagnosis not present

## 2022-06-24 DIAGNOSIS — Z1159 Encounter for screening for other viral diseases: Secondary | ICD-10-CM | POA: Diagnosis not present

## 2022-06-24 DIAGNOSIS — Z2821 Immunization not carried out because of patient refusal: Secondary | ICD-10-CM | POA: Diagnosis not present

## 2022-06-24 DIAGNOSIS — Z0000 Encounter for general adult medical examination without abnormal findings: Secondary | ICD-10-CM | POA: Diagnosis not present

## 2022-06-24 DIAGNOSIS — Z23 Encounter for immunization: Secondary | ICD-10-CM | POA: Diagnosis not present

## 2022-06-24 DIAGNOSIS — R7989 Other specified abnormal findings of blood chemistry: Secondary | ICD-10-CM | POA: Diagnosis not present

## 2022-06-24 DIAGNOSIS — R5383 Other fatigue: Secondary | ICD-10-CM | POA: Diagnosis not present

## 2022-07-06 ENCOUNTER — Other Ambulatory Visit: Payer: Self-pay | Admitting: Nurse Practitioner

## 2022-07-06 ENCOUNTER — Other Ambulatory Visit (HOSPITAL_BASED_OUTPATIENT_CLINIC_OR_DEPARTMENT_OTHER): Payer: Self-pay | Admitting: Nurse Practitioner

## 2022-07-06 DIAGNOSIS — M79661 Pain in right lower leg: Secondary | ICD-10-CM

## 2022-07-06 DIAGNOSIS — M79651 Pain in right thigh: Secondary | ICD-10-CM | POA: Diagnosis not present

## 2022-07-06 DIAGNOSIS — M7989 Other specified soft tissue disorders: Secondary | ICD-10-CM | POA: Diagnosis not present

## 2022-07-07 ENCOUNTER — Ambulatory Visit (HOSPITAL_BASED_OUTPATIENT_CLINIC_OR_DEPARTMENT_OTHER)
Admission: RE | Admit: 2022-07-07 | Discharge: 2022-07-07 | Disposition: A | Discharge status: Home / Self Care | Payer: BC Managed Care – PPO | Source: Ambulatory Visit | Attending: Nurse Practitioner | Admitting: Nurse Practitioner

## 2022-07-07 DIAGNOSIS — M79661 Pain in right lower leg: Secondary | ICD-10-CM | POA: Diagnosis not present

## 2022-07-07 DIAGNOSIS — M7989 Other specified soft tissue disorders: Secondary | ICD-10-CM | POA: Insufficient documentation

## 2022-07-07 DIAGNOSIS — I82461 Acute embolism and thrombosis of right calf muscular vein: Secondary | ICD-10-CM | POA: Diagnosis not present

## 2022-07-21 DIAGNOSIS — E039 Hypothyroidism, unspecified: Secondary | ICD-10-CM | POA: Diagnosis not present

## 2022-07-21 DIAGNOSIS — Z1159 Encounter for screening for other viral diseases: Secondary | ICD-10-CM | POA: Diagnosis not present

## 2022-07-21 DIAGNOSIS — E559 Vitamin D deficiency, unspecified: Secondary | ICD-10-CM | POA: Diagnosis not present

## 2022-07-21 DIAGNOSIS — I82401 Acute embolism and thrombosis of unspecified deep veins of right lower extremity: Secondary | ICD-10-CM | POA: Diagnosis not present

## 2022-07-21 DIAGNOSIS — Z720 Tobacco use: Secondary | ICD-10-CM | POA: Diagnosis not present

## 2023-02-21 ENCOUNTER — Ambulatory Visit
Admission: EM | Admit: 2023-02-21 | Discharge: 2023-02-21 | Disposition: A | Discharge status: Home / Self Care | Payer: 59 | Attending: Nurse Practitioner | Admitting: Nurse Practitioner

## 2023-02-21 DIAGNOSIS — Z1152 Encounter for screening for COVID-19: Secondary | ICD-10-CM | POA: Diagnosis present

## 2023-02-21 DIAGNOSIS — J069 Acute upper respiratory infection, unspecified: Secondary | ICD-10-CM | POA: Insufficient documentation

## 2023-02-21 LAB — POCT RAPID STREP A (OFFICE): Rapid Strep A Screen: NEGATIVE

## 2023-02-21 NOTE — ED Triage Notes (Signed)
Pt reports he had a fever, sore throat,and  head pressure x 1 day. Pt took z-cam, goody powder, and tylenol. Pt was in a outside pool yesterday.

## 2023-02-21 NOTE — ED Provider Notes (Signed)
[facility]    CSN: [id number] Arrival date & time: 02/21/23  1209      History   Chief Complaint No chief complaint on file.   HPI [name] Russell is a 42 y.o. male.   Patient presents today with 1 day history of tactile fever, chills, sore throat, left-sided headache and face/jaw pain.  He denies body aches, cough, shortness of breath or chest pain, runny or stuffy nose, abdominal pain, nausea/vomiting, diarrhea, decreased appetite, and fatigue.  Has been taking Zicam, Goody powders, and Tylenol which does seem to help with symptoms temporarily.    Past Medical History:  Diagnosis Date   Anxiety    Migraine    Pneumothorax    Radial nerve palsy     Patient Active Problem List   Diagnosis Date Noted   Anxiety state, unspecified 03/14/2014    Past Surgical History:  Procedure Laterality Date   THORACOTOMY         Home Medications    Prior to Admission medications   Medication Sig Start Date End Date Taking? Authorizing Provider  aspirin EC 81 MG tablet Take 81 mg by mouth daily. Swallow whole.   Yes [provider]  levothyroxine (SYNTHROID) 25 MCG tablet Take 25 mcg by mouth daily. 11/23/22  Yes [provider]  acetaminophen (TYLENOL) 500 MG tablet Take 1,000 mg by mouth every 6 (six) hours as needed. For migraine      [provider]  ALPRAZolam (XANAX) 0.25 MG tablet Take 0.25 mg by mouth daily as needed. For anxiety     [provider]  buPROPion (WELLBUTRIN SR) 150 MG 12 hr tablet Take 1 tablet (150 mg total) by mouth 2 (two) times daily. 10/15/14   Daphine Deutscher Mary-Margaret, FNP  hydrOXYzine (ATARAX/VISTARIL) 25 MG tablet Take 1 tablet (25 mg total) by mouth every 6 (six) hours. As needed for itching 04/16/14   Daphine Deutscher, Mary-Margaret, FNP  ibuprofen (ADVIL,MOTRIN) 600 MG tablet Take 1 tablet (600 mg total) by mouth every 6 (six) hours as needed. 10/20/16   Audry Pili, PA-C  methocarbamol (ROBAXIN) 500 MG tablet Take 1  tablet (500 mg total) by mouth 2 (two) times daily. 10/20/16   Audry Pili, PA-C  naproxen sodium (ANAPROX) 220 MG tablet Take 220 mg by mouth 2 (two) times daily with a meal.      [provider]  promethazine-dextromethorphan (PROMETHAZINE-DM) 6.25-15 MG/5ML syrup Take 5 mLs by mouth at bedtime as needed for cough. Do not take while driving or operating heavy machinery 03/02/22   Valentino Nose, NP  sodium chloride (OCEAN) 0.65 % SOLN nasal spray Place 1 spray into both nostrils as needed for congestion. 03/02/22   Valentino Nose, NP  triamcinolone cream (KENALOG) 0.1 % Apply 1 application topically 3 (three) times daily. 03/30/14   Pauline Aus, PA-C    Family History History reviewed. No pertinent family history.  Social History Social History   Tobacco Use   Smoking status: Every Day    Packs/day: 1.5    Types: Cigarettes   Smokeless tobacco: Never  Substance Use Topics   Alcohol use: Yes    Comment: OCCASIONAL     Allergies   Amoxicillin, Doxycycline, and Penicillins   Review of Systems Review of Systems Per HPI  Physical Exam Triage Vital Signs ED Triage Vitals  Enc Vitals Group     BP 02/21/23 1250 108/72     Pulse Rate 02/21/23 1250 89     Resp 02/21/23 1250  20     Temp 02/21/23 1250 98.1 F (36.7 C)     Temp Source 02/21/23 1250 Oral     SpO2 02/21/23 1250 98 %     Weight --      Height --      Head Circumference --      Peak Flow --      Pain Score 02/21/23 1251 4     Pain Loc --      Pain Edu? --      Excl. in GC? --    No data found.  Updated Vital Signs BP 108/72 (BP Location: Right Arm)   Pulse 89   Temp 98.1 F (36.7 C) (Oral)   Resp 20   SpO2 98%   Visual Acuity Right Eye Distance:   Left Eye Distance:   Bilateral Distance:    Right Eye Near:   Left Eye Near:    Bilateral Near:     Physical Exam Vitals and nursing note reviewed.  Constitutional:      General: He is not in acute distress.    Appearance:  Normal appearance. He is not ill-appearing or toxic-appearing.  HENT:     Head: Normocephalic and atraumatic.     Right Ear: Tympanic membrane, ear canal and external ear normal.     Left Ear: Tympanic membrane, ear canal and external ear normal.     Nose: No congestion or rhinorrhea.     Mouth/Throat:     Mouth: Mucous membranes are moist.     Pharynx: Oropharynx is clear. Posterior oropharyngeal erythema present. No oropharyngeal exudate.  Eyes:     General: No scleral icterus.    Extraocular Movements: Extraocular movements intact.  Cardiovascular:     Rate and Rhythm: Normal rate and regular rhythm.  Pulmonary:     Effort: Pulmonary effort is normal. No respiratory distress.     Breath sounds: Normal breath sounds. No wheezing, rhonchi or rales.  Abdominal:     General: Abdomen is flat. Bowel sounds are normal. There is no distension.     Palpations: Abdomen is soft.  Musculoskeletal:     Cervical back: Normal range of motion and neck supple.  Lymphadenopathy:     Cervical: No cervical adenopathy.  Skin:    General: Skin is warm and dry.     Coloration: Skin is not jaundiced or pale.     Findings: No erythema or rash.  Neurological:     Mental Status: He is alert and oriented to person, place, and time.  Psychiatric:        Behavior: Behavior is cooperative.      UC Treatments / Results  Labs (all labs ordered are listed, but only abnormal results are displayed) Labs Reviewed  SARS CORONAVIRUS 2 (TAT 6-24 HRS)  POCT RAPID STREP A (OFFICE)    EKG   Radiology No results found.  Procedures Procedures (including critical care time)  Medications Ordered in UC Medications - No data to display  Initial Impression / Assessment and Plan / UC Course  I have reviewed the triage vital signs and the nursing notes.  Pertinent labs & imaging results that were available during my care of the patient were reviewed by me and considered in my medical decision making (see  chart for details).   Patient is well-appearing, normotensive, afebrile, not tachycardic, not tachypneic, oxygenating well on room air.    1. Viral URI 2. Encounter for screening for COVID-19 Suspect viral etiology COVID-19 test is  pending Rapid strep throat test is negative Throat culture deferred; Centor score today is 1 Supportive care discussed with patient ER and return precautions discussed  The patient was given the opportunity to ask questions.  All questions answered to their satisfaction.  The patient is in agreement to this plan.    Final Clinical Impressions(s) / UC Diagnoses   Final diagnoses:  Viral URI  Encounter for screening for COVID-19     Discharge Instructions      You have a viral upper respiratory infection.  Symptoms should improve over the next week to 10 days.  If you develop chest pain or shortness of breath, go to the emergency room.  We have tested you today for COVID-19.  You will see the results in Mychart and we will call you with positive results.  Please stay home and isolate until you are aware of the results.    Some things that can make you feel better are: - Increased rest - Increasing fluid with water/sugar free electrolytes - Acetaminophen and ibuprofen as needed for fever/pain - Salt water gargling, chloraseptic spray and throat lozenges for sore throat - OTC guaifenesin (Mucinex) 600 mg twice daily for congestion - Saline sinus flushes or a neti pot - Humidifying the air     ED Prescriptions   None    PDMP not reviewed this encounter.   Valentino Nose, NP 02/21/23 1332

## 2023-02-21 NOTE — Discharge Instructions (Signed)
You have a viral upper respiratory infection.  Symptoms should improve over the next week to 10 days.  If you develop chest pain or shortness of breath, go to the emergency room.  We have tested you today for COVID-19.  You will see the results in Mychart and we will call you with positive results.  Please stay home and isolate until you are aware of the results.    Some things that can make you feel better are: - Increased rest - Increasing fluid with water/sugar free electrolytes - Acetaminophen and ibuprofen as needed for fever/pain - Salt water gargling, chloraseptic spray and throat lozenges for sore throat - OTC guaifenesin (Mucinex) 600 mg twice daily for congestion - Saline sinus flushes or a neti pot - Humidifying the air 

## 2023-02-28 LAB — SARS CORONAVIRUS 2 (TAT 6-24 HRS): SARS Coronavirus 2: NEGATIVE

## 2023-04-28 ENCOUNTER — Emergency Department (HOSPITAL_COMMUNITY): Payer: 59

## 2023-04-28 ENCOUNTER — Encounter (HOSPITAL_COMMUNITY): Payer: Self-pay

## 2023-04-28 ENCOUNTER — Other Ambulatory Visit: Payer: Self-pay

## 2023-04-28 ENCOUNTER — Emergency Department (HOSPITAL_COMMUNITY)
Admission: EM | Admit: 2023-04-28 | Discharge: 2023-04-29 | Disposition: A | Discharge status: Home / Self Care | Payer: 59 | Attending: Emergency Medicine | Admitting: Emergency Medicine

## 2023-04-28 DIAGNOSIS — R0602 Shortness of breath: Secondary | ICD-10-CM | POA: Insufficient documentation

## 2023-04-28 DIAGNOSIS — Z7982 Long term (current) use of aspirin: Secondary | ICD-10-CM | POA: Insufficient documentation

## 2023-04-28 DIAGNOSIS — R7989 Other specified abnormal findings of blood chemistry: Secondary | ICD-10-CM | POA: Insufficient documentation

## 2023-04-28 DIAGNOSIS — R42 Dizziness and giddiness: Secondary | ICD-10-CM | POA: Insufficient documentation

## 2023-04-28 LAB — CBC WITH DIFFERENTIAL/PLATELET
Abs Immature Granulocytes: 0 10*3/uL (ref 0.00–0.07)
Basophils Absolute: 0 10*3/uL (ref 0.0–0.1)
Basophils Relative: 1 %
Eosinophils Absolute: 0 10*3/uL (ref 0.0–0.5)
Eosinophils Relative: 0 %
HCT: 45.6 % (ref 39.0–52.0)
Hemoglobin: 15.2 g/dL (ref 13.0–17.0)
Immature Granulocytes: 0 %
Lymphocytes Relative: 29 %
Lymphs Abs: 1.3 10*3/uL (ref 0.7–4.0)
MCH: 30.9 pg (ref 26.0–34.0)
MCHC: 33.3 g/dL (ref 30.0–36.0)
MCV: 92.7 fL (ref 80.0–100.0)
Monocytes Absolute: 0.4 10*3/uL (ref 0.1–1.0)
Monocytes Relative: 8 %
Neutro Abs: 2.8 10*3/uL (ref 1.7–7.7)
Neutrophils Relative %: 62 %
Platelets: 186 10*3/uL (ref 150–400)
RBC: 4.92 MIL/uL (ref 4.22–5.81)
RDW: 13.1 % (ref 11.5–15.5)
WBC: 4.4 10*3/uL (ref 4.0–10.5)
nRBC: 0 % (ref 0.0–0.2)

## 2023-04-28 LAB — BASIC METABOLIC PANEL
Anion gap: 12 (ref 5–15)
BUN: 7 mg/dL (ref 6–20)
CO2: 25 mmol/L (ref 22–32)
Calcium: 9.2 mg/dL (ref 8.9–10.3)
Chloride: 101 mmol/L (ref 98–111)
Creatinine, Ser: 0.87 mg/dL (ref 0.61–1.24)
GFR, Estimated: >60 mL/min (ref >60)
Glucose, Bld: 84 mg/dL (ref 70–99)
Potassium: 3.9 mmol/L (ref 3.5–5.1)
Sodium: 138 mmol/L (ref 135–145)

## 2023-04-28 LAB — D-DIMER, QUANTITATIVE: D-Dimer, Quant: 0.59 ug/mL-FEU — ABNORMAL HIGH (ref 0.00–0.50)

## 2023-04-28 MED ORDER — IOHEXOL 350 MG/ML SOLN
75.0000 mL | Freq: Once | INTRAVENOUS | Status: AC | PRN
  Administered 2023-04-28: 75 mL via INTRAVENOUS

## 2023-04-28 NOTE — ED Triage Notes (Signed)
Pt coming in for [facility] that he states he cannot get a deep belly breath in. Has a Hx of blood clots, and also pneumothoraces from when he was younger. Pt states he does have some anxiety out of fear of not being able to get a good breath but currently stable in triage and has no other complaints

## 2023-04-28 NOTE — ED Notes (Signed)
Patient ambulated to the restroom

## 2023-04-28 NOTE — ED Notes (Signed)
Patient transported to CT 

## 2023-04-28 NOTE — ED Provider Notes (Signed)
I saw and evaluated the patient, reviewed the resident's note and I agree with the findings and plan.  EKG Interpretation Date/Time:  Wednesday April 28 2023 21:11:14 EDT Ventricular Rate:  87 PR Interval:  183 QRS Duration:  91 QT Interval:  346 QTC Calculation: 417 R Axis:   81  Text Interpretation: Sinus rhythm Confirmed by Lorre Nick ([id number]) on 04/28/2023 9:62:52 PM   42 year old male who presents with shortness of breath.  States he feels that he just cannot take a deep breath in.  History of pneumothorax and chest x-ray here without acute findings.  Patient does appear to be very anxious here.  Will check D-dimer and likely discharge   Lorre Nick, MD 04/28/23 2152

## 2023-04-28 NOTE — ED Provider Notes (Signed)
[place] EMERGENCY DEPARTMENT AT [facility] Provider Note   CSN: [id number] Arrival date & time: 04/28/23  2007  History Past Medical History:  Diagnosis Date   Anxiety    Migraine    Pneumothorax    Radial nerve palsy     Chief Complaint  Patient presents with   Shortness of Breath    [name] Russell is a 42 y.o. male.  [name] Russell is a 42 year old male presenting with shortness of breath.  Last night, he noted that he cannot take a deep breath.  This progressively worsened over the course of the day and became associated with dizziness this evening.  He denies any chest pain.  He also denies any recent sick contacts or travel.  Patient notes that he is under significant stress and has a history of long panic attacks.  The history is provided by the patient.  Shortness of Breath Severity:  Moderate Onset quality:  Gradual Duration:  1 day Timing:  Constant Progression:  Worsening Chronicity:  New Relieved by:  Nothing Worsened by:  Deep breathing Risk factors: hx of PE/DVT and tobacco use    Home Medications Prior to Admission medications   Medication Sig Start Date End Date Taking? Authorizing Provider  acetaminophen (TYLENOL) 500 MG tablet Take 1,000 mg by mouth every 6 (six) hours as needed. For migraine     Yes [provider]  albuterol (VENTOLIN HFA) 108 (90 Base) MCG/ACT inhaler Inhale 1 puff into the lungs every 6 (six) hours as needed for wheezing or shortness of breath.   Yes [provider]  ALPRAZolam Prudy Feeler) 0.5 MG tablet Take 0.5 mg by mouth 3 (three) times daily.   Yes [provider]  aspirin EC 81 MG tablet Take 81 mg by mouth daily. Swallow whole.   Yes [provider]  Azelastine HCl 137 MCG/SPRAY SOLN Place 1 spray into the nose 2 (two) times daily. 03/12/23  Yes [provider]  buPROPion (WELLBUTRIN XL) 300 MG 24 hr tablet Take 300 mg by mouth daily.   Yes [provider]   fluticasone-salmeterol (ADVAIR) 250-50 MCG/ACT AEPB Inhale 1 puff into the lungs 2 (two) times daily. 03/12/23  Yes [provider]  ibuprofen (ADVIL,MOTRIN) 600 MG tablet Take 1 tablet (600 mg total) by mouth every 6 (six) hours as needed. 10/20/16  Yes Audry Pili, PA-C  levothyroxine (SYNTHROID) 25 MCG tablet Take 25 mcg by mouth daily. 11/23/22  Yes [provider]  Multiple Vitamin (MULTIVITAMIN WITH MINERALS) TABS tablet Take 1 tablet by mouth daily.   Yes [provider]  ALPRAZolam (XANAX) 0.25 MG tablet Take 0.25 mg by mouth daily as needed. For anxiety  Patient not taking: Reported on 04/28/2023    [provider]  buPROPion (WELLBUTRIN SR) 150 MG 12 hr tablet Take 1 tablet (150 mg total) by mouth 2 (two) times daily. Patient not taking: Reported on 04/28/2023 10/15/14   Bennie Pierini, FNP  hydrOXYzine (ATARAX/VISTARIL) 25 MG tablet Take 1 tablet (25 mg total) by mouth every 6 (six) hours. As needed for itching Patient not taking: Reported on 04/28/2023 04/16/14   Bennie Pierini, FNP  methocarbamol (ROBAXIN) 500 MG tablet Take 1 tablet (500 mg total) by mouth 2 (two) times daily. Patient not taking: Reported on 04/28/2023 10/20/16   Audry Pili, PA-C  promethazine-dextromethorphan (PROMETHAZINE-DM) 6.25-15 MG/5ML syrup Take 5 mLs by mouth at bedtime as needed for cough. Do not take while driving or operating heavy machinery Patient not taking:  Reported on 04/28/2023 03/02/22   Valentino Nose, NP  sodium chloride (OCEAN) 0.65 % SOLN nasal spray Place 1 spray into both nostrils as needed for congestion. Patient not taking: Reported on 04/28/2023 03/02/22   Cathlean Marseilles A, NP  triamcinolone cream (KENALOG) 0.1 % Apply 1 application topically 3 (three) times daily. Patient not taking: Reported on 04/28/2023 03/30/14   Pauline Aus, PA-C      Allergies    Amoxicillin, Doxycycline, and Penicillins    Review of Systems   Review of Systems   Constitutional: Negative.   HENT: Negative.    Eyes: Negative.   Respiratory:  Positive for shortness of breath.   Cardiovascular: Negative.   Gastrointestinal: Negative.   Endocrine: Negative.   Musculoskeletal: Negative.   Skin: Negative.   Allergic/Immunologic: Negative.   Neurological:  Positive for dizziness.  Hematological: Negative.   Psychiatric/Behavioral: Negative.      Physical Exam Updated Vital Signs BP 110/70   Pulse 80   Temp 98.1 F (36.7 C) (Oral)   Resp 10   Ht 5\' 9"  (1.753 m)   Wt 72.6 kg   SpO2 97%   BMI 23.63 kg/m  Physical Exam Constitutional:      Appearance: Normal appearance.  HENT:     Head: Normocephalic and atraumatic.  Cardiovascular:     Rate and Rhythm: Normal rate and regular rhythm.     Heart sounds: Normal heart sounds.  Pulmonary:     Effort: Pulmonary effort is normal.     Breath sounds: Normal breath sounds.  Abdominal:     General: Abdomen is flat.     Palpations: Abdomen is soft.  Musculoskeletal:     Right lower leg: No edema.     Left lower leg: No edema.  Skin:    General: Skin is warm.  Neurological:     General: No focal deficit present.  Psychiatric:        Attention and Perception: Attention and perception normal.        Mood and Affect: Mood is anxious.        Behavior: Behavior is cooperative.    ED Results / Procedures / Treatments   Labs (all labs ordered are listed, but only abnormal results are displayed) Labs Reviewed  D-DIMER, QUANTITATIVE - Abnormal; Notable for the following components:      Result Value   D-Dimer, Quant 0.59 (*)    All other components within normal limits  CBC WITH DIFFERENTIAL/PLATELET  BASIC METABOLIC PANEL    EKG EKG Interpretation Date/Time:  Wednesday April 28 2023 21:11:14 EDT Ventricular Rate:  87 PR Interval:  183 QRS Duration:  91 QT Interval:  346 QTC Calculation: 417 R Axis:   81  Text Interpretation: Sinus rhythm Confirmed by Lorre Nick ([id number]) on  04/28/2023 9:33:21 PM  Radiology CT Angio Chest PE W and/or Wo Contrast  Result Date: 04/28/2023 CLINICAL DATA:  Elevated D-dimer. EXAM: CT ANGIOGRAPHY CHEST WITH CONTRAST TECHNIQUE: Multidetector CT imaging of the chest was performed using the standard protocol during bolus administration of intravenous contrast. Multiplanar CT image reconstructions and MIPs were obtained to evaluate the vascular anatomy. RADIATION DOSE REDUCTION: This exam was performed according to the departmental dose-optimization program which includes automated exposure control, adjustment of the mA and/or kV according to patient size and/or use of iterative reconstruction technique. CONTRAST:  75mL OMNIPAQUE IOHEXOL 350 MG/ML SOLN COMPARISON:  August 10, 2017 FINDINGS: Cardiovascular: The thoracic aorta is normal in appearance. Satisfactory opacification of the pulmonary  arteries to the segmental level. No evidence of pulmonary embolism. Normal heart size. No pericardial effusion. Mediastinum/Nodes: No enlarged mediastinal, hilar, or axillary lymph nodes. Thyroid gland, trachea, and esophagus demonstrate no significant findings. Lungs/Pleura: Stable biapical emphysematous lung disease is seen. There is no evidence of an acute infiltrate, pleural effusion or pneumothorax. Upper Abdomen: No acute abnormality. Musculoskeletal: No chest wall abnormality. No acute or significant osseous findings. Review of the MIP images confirms the above findings. IMPRESSION: 1. No evidence of pulmonary embolism or acute cardiopulmonary disease. 2. Stable biapical emphysematous lung disease. Emphysema (ICD10-J43.9). Electronically Signed   By: Aram Candela M.D.   On: 04/28/2023 23:22   DG Chest 2 View  Result Date: 04/28/2023 CLINICAL DATA:  Dyspnea EXAM: CHEST - 2 VIEW COMPARISON:  None Available. FINDINGS: The heart size and mediastinal contours are within normal limits. Both lungs are clear. The visualized skeletal structures are unremarkable.  IMPRESSION: No active cardiopulmonary disease. Electronically Signed   By: Helyn Numbers M.D.   On: 04/28/2023 20:35    Medications Ordered in ED Medications  iohexol (OMNIPAQUE) 350 MG/ML injection 75 mL (75 mLs Intravenous Contrast Given 04/28/23 2311)    ED Course/ Medical Decision Making/ A&P Patient presented with 1 day history of shortness of breath that is not associated with chest pain or other signs or symptoms.  Chest x-ray demonstrated no active cardiopulmonary disease.  CBC within normal limits.  EKG normal.  D-dimer mildly elevated at 0.59. CTA demonstrated no evidence of PE or acute cardiopulmonary disease. Patient history notable for DVT but no signs or symptoms suggestive of current clot.  Patient history notable for significant anxiety and panic attacks. PE unlikely due to lack of chest pain and normal vitals.  Pneumothorax unlikely due to negative chest x-ray findings. Current signs and symptoms likely secondary to stable biapical emphysematous lung disease.                                Medical Decision Making Amount and/or Complexity of Data Reviewed External Data Reviewed: labs and radiology. Labs: ordered. Decision-making details documented in ED Course. Radiology: ordered. Decision-making details documented in ED Course. ECG/medicine tests:  Decision-making details documented in ED Course.  Risk Prescription drug management.   Final Clinical Impression(s) / ED Diagnoses Final diagnoses:  SOB (shortness of breath)    Rx / DC Orders ED Discharge Orders     None         Morrie Sheldon, MD 04/29/23 1458    Lorre Nick, MD 04/29/23 2242

## 2023-06-04 ENCOUNTER — Telehealth: Payer: Self-pay

## 2023-06-04 ENCOUNTER — Inpatient Hospital Stay: Payer: 59 | Attending: Hematology and Oncology | Admitting: Hematology and Oncology

## 2023-06-04 ENCOUNTER — Inpatient Hospital Stay: Payer: 59

## 2023-06-04 NOTE — Telephone Encounter (Signed)
This RN contacted patient to see if he was going to be able to make it to his 10:00am appt. Pt states he never received a phone call for a date and time. This RN asked if he could come in at 12:15 today, however, pt has another appt scheduled and cannot make it. This RN assured pt that we would get him scheduled and give him a call for a new appt. Pt verbalized understanding.

## 2023-06-24 ENCOUNTER — Inpatient Hospital Stay: Payer: 59 | Attending: Hematology and Oncology | Admitting: Hematology and Oncology

## 2023-06-24 ENCOUNTER — Inpatient Hospital Stay: Payer: 59

## 2023-06-24 DIAGNOSIS — Z86718 Personal history of other venous thrombosis and embolism: Secondary | ICD-10-CM | POA: Insufficient documentation

## 2023-06-24 DIAGNOSIS — Z7982 Long term (current) use of aspirin: Secondary | ICD-10-CM | POA: Insufficient documentation

## 2023-07-01 ENCOUNTER — Telehealth: Payer: Self-pay | Admitting: Hematology

## 2023-07-01 NOTE — Telephone Encounter (Signed)
Called and spoke with patient. Patient wanted to reschedule; however call was disconnect left message to call back. Number provided on vm

## 2023-07-02 ENCOUNTER — Telehealth: Payer: Self-pay

## 2023-07-02 ENCOUNTER — Other Ambulatory Visit: Payer: 59

## 2023-07-02 NOTE — Telephone Encounter (Signed)
Scheduled appointments per referral. Patient is aware of the appointment time and date as well as the address. Patient was informed to arrive 10-15 minutes prior with updated insurance information. All questions were answered.

## 2023-07-10 NOTE — Progress Notes (Unsigned)
[facility] CONSULT NOTE  Patient Care Team: Alysia Penna, MD as PCP - General (Internal Medicine)   ASSESSMENT & PLAN:  42 y.o.male referred to [facility] for history of DVT. 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  Per patient, he had first episode of a right lower extremity in October 2023.  He completed 6 months of anticoagulation.  After discontinuing, he developed left lower leg pain just like the first episode and was clinically diagnosed as DVT.  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  In both scenario, symptoms resolved after starting anticoagulation.  Currently he has been on aspirin 81 mg daily without signs and symptoms of DVT and PE.  We had a long discussion regarding etiology, risk factors, management, symptoms of DVT and PE clinic.  I counseled patient extensively on monitoring for signs and symptoms.  We also discussed thrombophilia testing.  Given his history of recurrent DVT, recommend return for follow-up before future surgery, especially orthopedic surgeries. We also discussed modifiable risk factors like smoking cessation.  First episode: RLE DVT. [place] proven Second episode: LLE DVT. No [place]  Setting: unprovoked Treatment: resolved on Xarelto. Now on aspirin 81 mg daily  Patient education for risk factors and prevention of clotting We talked about modifiable risk factors.  Prevention of clotting like deep vein thrombosis including: Strong risk factors including fractures of lower limb, hospitalization for severe illness, such as heart failure, myocardial infarction, spinal cord injury, major trauma, hip or knee replacement, and previous VTE. avoid prolonged immobilization and moving extremities every 1-2 hours during long car rides or flights.  Taking a break and moving extremities if working in a job setting with prolonged sitting.   Avoid dehydration, especially in high altitude. Avoid  cigarette smoking Avoid use of hormone replacement therapy, estrogen-containing contraceptive in women.   Maintaining healthy lifestyle to prevent development of diabetes.  Weight loss if BMI over 30.  Regular exercises but not extreme.   Other risk factors for clotting are surgery, hospitalization, inflammatory disease or severe infection, trauma or injuries from inflammatory state and stasis. If developing one-sided leg swelling, pain, color change, chest pain, sudden short of breath, difficulty taking deep breaths, taking deep breath with chest discomfort or pain, dizziness or heart racing sensation, go to the emergency room immediately for evaluation. If developing trauma, uncontrolled bleeding, such as bloody stools report ED immediately. Avoid NSAIDs, aspirin while on blood thinner.  I have not made a return appointment for the patient to come back. I would be happy to assist in perioperative DVT management in the future should he need any surgery or procedures.  Orders Placed This Encounter  Procedures   Antithrombin III*    Standing Status:   Future    Standing Expiration Date:   07/11/2024   Factor 5 Leiden*    Standing Status:   Future    Standing Expiration Date:   07/11/2024   Prothrombin gene mutation*    Standing Status:   Future    Standing Expiration Date:   07/11/2024   Protein S, total    Standing Status:   Future    Standing Expiration Date:   07/11/2024   Protein C, total*    Standing Status:   Future    Standing Expiration Date:   07/11/2024    All questions were answered. The patient knows to call the clinic with any problems, questions or concerns.  Melven Sartorius, MD 10/21/20243:31 PM  CHIEF COMPLAINTS/PURPOSE OF CONSULTATION:  History of DVT  HISTORY OF PRESENTING ILLNESS:  [name] Russell 42 y.o. male is here because of history of DVT.  He thought he pulled his muscle and lasted for about 1.5 weeks. No swelling, redness but pain like pulled muscle.  This resulted in ordering [place].  He had a field trip the day before evaluation. He spends a lot of time driving. Close to Aug time frame he was taking much break. He can go all day without much fluid. He does smoke. No DM, IBD.  07/07/22 [place] Partially occlusive DVT of the right gastrocnemius vein.   He was started on Xarelto and stopped after 6 months. Symptoms resolved after 1 week.  He thought he had DVT after stopped and was diagnosed of LLE DVT without [place]. Pain was similar.  Symptoms resolved after Xarelto started. He stopped after 3 months.   No cramps since then. He was switched to aspirin 81 mg daily. No bleeding, blood stool. He bleeds longer on either one.   04/28/23 CTA 1. No evidence of pulmonary embolism or acute cardiopulmonary disease. 2. Stable biapical emphysematous lung disease.  No family history of DVT he knows of. He has 4 biologic siblings without clot.   MEDICAL HISTORY:  Past Medical History:  Diagnosis Date   Anxiety    Migraine    Pneumothorax    Radial nerve palsy     SURGICAL HISTORY: Past Surgical History:  Procedure Laterality Date   THORACOTOMY      SOCIAL HISTORY: Social History   Socioeconomic History   Marital status: Married    Spouse name: Not on file   Number of children: Not on file   Years of education: Not on file   Highest education level: Not on file  Occupational History   Not on file  Tobacco Use   Smoking status: Every Day    Current packs/day: 1.50    Types: Cigarettes   Smokeless tobacco: Never  Substance and Sexual Activity   Alcohol use: Yes    Comment: OCCASIONAL   Drug use: Not on file   Sexual activity: Not on file  Other Topics Concern   Not on file  Social History Narrative   Not on file   Social Determinants of Health   Financial Resource Strain: Not on file  Food Insecurity: Not on file  Transportation Needs: Not on file  Physical Activity: Not on file  Stress: Not on file  Social Connections: Not on  file  Intimate Partner Violence: Not on file    FAMILY HISTORY: No family history on file.  ALLERGIES:  is allergic to amoxicillin, doxycycline, and penicillins.  MEDICATIONS:  Current Outpatient Medications  Medication Sig Dispense Refill   acetaminophen (TYLENOL) 500 MG tablet Take 1,000 mg by mouth every 6 (six) hours as needed. For migraine       albuterol (VENTOLIN HFA) 108 (90 Base) MCG/ACT inhaler Inhale 1 puff into the lungs every 6 (six) hours as needed for wheezing or shortness of breath.     ALPRAZolam (XANAX XR) 0.5 MG 24 hr tablet Take 0.5 mg by mouth in the morning, at noon, and at bedtime.     aspirin EC 81 MG tablet Take 81 mg by mouth daily. Swallow whole.     Azelastine HCl 137 MCG/SPRAY SOLN Place 1 spray into the nose 2 (two) times daily.     fluticasone-salmeterol (ADVAIR) 250-50 MCG/ACT AEPB Inhale 1 puff  into the lungs 2 (two) times daily.     ibuprofen (ADVIL,MOTRIN) 600 MG tablet Take 1 tablet (600 mg total) by mouth every 6 (six) hours as needed. 30 tablet 0   levothyroxine (SYNTHROID) 25 MCG tablet Take 25 mcg by mouth daily.     methocarbamol (ROBAXIN) 500 MG tablet Take 1 tablet (500 mg total) by mouth 2 (two) times daily. 20 tablet 0   Multiple Vitamin (MULTIVITAMIN WITH MINERALS) TABS tablet Take 1 tablet by mouth daily.     promethazine-dextromethorphan (PROMETHAZINE-DM) 6.25-15 MG/5ML syrup Take 5 mLs by mouth at bedtime as needed for cough. Do not take while driving or operating heavy machinery 118 mL 0   sodium chloride (OCEAN) 0.65 % SOLN nasal spray Place 1 spray into both nostrils as needed for congestion. 88 mL 0   No current facility-administered medications for this visit.    REVIEW OF SYSTEMS:   Constitutional: Denies loss of appetite or unexpected weight loss. Respiratory: Denies cough, shortness of breath or wheezes Cardiovascular: Denies palpitation, chest discomfort. History of panic attacks Gastrointestinal:  Denies abdominal  pain  PHYSICAL EXAMINATION:  Vitals:   07/12/23 1437  BP: 111/82  Pulse: 99  Resp: 16  Temp: 97.7 F (36.5 C)  SpO2: 98%   Filed Weights   07/12/23 1437  Weight: 176 lb 12.8 oz (80.2 kg)    GENERAL: alert, no distress and comfortable SKIN: skin color normal LUNGS: Effort normal and no respiratory distress.  Musculoskeletal: no cyanosis and edema.  Right lower extremity no edema; left lower extremity no edema  LABORATORY DATA:  I have reviewed the data as listed   RADIOGRAPHIC STUDIES: I have personally reviewed the radiological images as listed and agreed with the findings in the report. No results found.

## 2023-07-12 ENCOUNTER — Inpatient Hospital Stay: Payer: 59

## 2023-07-12 ENCOUNTER — Other Ambulatory Visit: Payer: Self-pay

## 2023-07-12 VITALS — BP 111/82 | HR 99 | Temp 97.7°F | Resp 16 | Wt 176.8 lb

## 2023-07-12 DIAGNOSIS — Z7982 Long term (current) use of aspirin: Secondary | ICD-10-CM

## 2023-07-12 DIAGNOSIS — Z86718 Personal history of other venous thrombosis and embolism: Secondary | ICD-10-CM

## 2023-07-12 LAB — ANTITHROMBIN III: AntiThromb III Func: 111 % (ref 75–120)

## 2023-07-12 NOTE — Patient Instructions (Signed)
Patient education for risk factors and prevention of clotting We talked about modifiable risk factors.  Prevention of clotting like deep vein thrombosis including: Strong risk factors including fractures of lower limb, hospitalization for severe illness, such as heart failure, myocardial infarction, spinal cord injury, major trauma, hip or knee replacement, and previous blood clot. avoid prolonged immobilization and moving extremities every 1-2 hours during long car rides or flights.  Taking a break and moving extremities if working in a job setting with prolonged sitting.   Avoid dehydration, especially in high altitude. Avoid cigarette smoking Avoid use of hormone replacement therapy, estrogen-containing contraceptive in women.   Maintaining healthy lifestyle to prevent development of diabetes.  Weight loss if BMI over 30.  Regular exercises but not extreme.   Other risk factors for clotting are surgery, hospitalization, inflammatory disease or severe infection, trauma or injuries from inflammatory state and stasis. If developing one-sided leg swelling, pain, color change, chest pain, sudden short of breath, difficulty taking deep breaths, taking deep breath with chest discomfort or pain, dizziness or heart racing sensation, go to the emergency room immediately for evaluation. If developing trauma, uncontrolled bleeding, such as bloody stools report ED immediately. Avoid NSAIDs, aspirin while on blood thinner.

## 2023-07-13 LAB — PROTEIN S, TOTAL: Protein S Ag, Total: 68 % (ref 60–150)

## 2023-07-14 LAB — PROTEIN C, TOTAL: Protein C, Total: 94 % (ref 60–150)

## 2023-07-16 LAB — FACTOR 5 LEIDEN

## 2023-07-19 LAB — PROTHROMBIN GENE MUTATION

## 2023-07-20 ENCOUNTER — Telehealth: Payer: Self-pay

## 2023-07-20 DIAGNOSIS — Z86718 Personal history of other venous thrombosis and embolism: Secondary | ICD-10-CM

## 2023-07-20 MED ORDER — NICOTINE 21 MG/24HR TD PT24: MEDICATED_PATCH | TRANSDERMAL | 0 refills | Status: AC

## 2023-07-20 MED ORDER — NICOTINE 14 MG/24HR TD PT24: MEDICATED_PATCH | TRANSDERMAL | 0 refills | Status: AC

## 2023-07-20 MED ORDER — NICOTINE 7 MG/24HR TD PT24: MEDICATED_PATCH | TRANSDERMAL | 0 refills | Status: AC

## 2023-07-20 NOTE — Telephone Encounter (Signed)
TC to pt per Dr. Cherly Hensen to inform of lab work being negative for genetic component to blood clotting. Discussed common risk factors for blood clots, and pt reports that he is a smoker and would like to stop smoking. Requests Rx for Chantix due to this helping him quit for several months in the past. Dr. Cherly Hensen denies this request but states pt can have Rx for Nicotine patches, which is being submitted to pt's pharmacy. Pt states he smokes more than 10 cigarettes per day. Pt aware of new Rx and has no further questions or concerns.

## 2023-07-20 NOTE — Telephone Encounter (Signed)
-----   Message from Melven Sartorius sent at 07/20/2023 11:03 AM EDT ----- Bonita Quin can prescribe:  Patients smoking >10 cigarettes/day: Begin with step 1 (21 mg/day) for 6 weeks, followed by step 2 (14 mg/day) for 2 weeks; finish with step 3 (7 mg/day) for 2 weeks.  Patients smoking <=10 cigarettes/day: Begin with step 2 (14 mg/day) for 6 weeks, followed by step 3 (7 mg/day) for 2 weeks. ----- Message ----- From: Yehuda Budd, RN Sent: 07/20/2023  10:52 AM EDT To: Melven Sartorius, MD  Okay, he said he would like to try Nicotine patches again. I can call them in, or do you want me to tell him he needs to contact PCP? ----- Message ----- From: Melven Sartorius, MD Sent: 07/20/2023  10:33 AM EDT To: Yehuda Budd, RN  I am not familiar with how to use Chantix. I only give Nicotine patches.    He can also try to call 1-800-QUIT-NOW. Maybe other options and contact his PCP if needed prescription. ----- Message ----- From: Yehuda Budd, RN Sent: 07/20/2023   9:33 AM EDT To: Melven Sartorius, MD  I called pt and informed him. He says he's a smoker and was able to quit smoking for approx 3 months after taking Chantix in 2017, but started smoking again after a death in the family. He says nicotine patches do not help him, but his PCP won't prescribe Chantix for some reason. He is wondering if you could prescribe this or if you have any recommendations to help him. He said he quit smoking after taking the "starter pack" of Chantix and maybe a few months of medication. He says he is already taking Wellbutrin. ----- Message ----- From: Melven Sartorius, MD Sent: 07/19/2023  10:34 PM EDT To: Yehuda Budd, RN  Mandy please let him know testing for potential know genetic contribution for developing clot were negative. This does not mean patient is not at risk of future clotting but means those genetic factors tested are contributory. No change from previous recommendations. May follow up before future  major surgeries.

## 2024-01-27 ENCOUNTER — Ambulatory Visit: Admitting: Podiatry
